# Patient Record
Sex: Female | Born: 1960 | Race: Black or African American | Hispanic: No | Marital: Single | Smoking: Never smoker
Health system: Southern US, Community
[De-identification: ages and names within clinical notes are randomized; demographics above are authoritative.]

## PROBLEM LIST (undated history)

## (undated) DIAGNOSIS — R7303 Prediabetes: Secondary | ICD-10-CM

## (undated) DIAGNOSIS — E559 Vitamin D deficiency, unspecified: Secondary | ICD-10-CM

## (undated) DIAGNOSIS — R011 Cardiac murmur, unspecified: Secondary | ICD-10-CM

## (undated) DIAGNOSIS — I00 Rheumatic fever without heart involvement: Secondary | ICD-10-CM

## (undated) HISTORY — DX: Rheumatic fever without heart involvement: I00

## (undated) HISTORY — DX: Cardiac murmur, unspecified: R01.1

## (undated) HISTORY — DX: Vitamin D deficiency, unspecified: E55.9

## (undated) HISTORY — PX: CYSTECTOMY: SUR359

## (undated) HISTORY — DX: Prediabetes: R73.03

---

## 1993-07-30 HISTORY — PX: OTHER SURGICAL HISTORY: SHX169

## 1997-10-01 ENCOUNTER — Encounter: Admission: RE | Admit: 1997-10-01 | Discharge: 1997-10-01 | Payer: Self-pay | Admitting: Sports Medicine

## 1997-10-15 ENCOUNTER — Encounter: Admission: RE | Admit: 1997-10-15 | Discharge: 1997-10-15 | Payer: Self-pay | Admitting: Family Medicine

## 1997-11-30 ENCOUNTER — Emergency Department (HOSPITAL_COMMUNITY): Admission: EM | Admit: 1997-11-30 | Discharge: 1997-11-30 | Payer: Self-pay

## 1997-12-18 ENCOUNTER — Encounter: Admission: RE | Admit: 1997-12-18 | Discharge: 1997-12-18 | Payer: Self-pay | Admitting: Family Medicine

## 1998-12-23 ENCOUNTER — Other Ambulatory Visit: Admission: RE | Admit: 1998-12-23 | Discharge: 1998-12-23 | Payer: Self-pay | Admitting: *Deleted

## 1999-01-24 ENCOUNTER — Ambulatory Visit (HOSPITAL_COMMUNITY): Admission: RE | Admit: 1999-01-24 | Discharge: 1999-01-24 | Payer: Self-pay | Admitting: Obstetrics and Gynecology

## 1999-02-27 ENCOUNTER — Encounter: Admission: RE | Admit: 1999-02-27 | Discharge: 1999-02-27 | Payer: Self-pay | Admitting: Family Medicine

## 1999-03-27 ENCOUNTER — Encounter: Payer: Self-pay | Admitting: Emergency Medicine

## 1999-03-27 ENCOUNTER — Emergency Department (HOSPITAL_COMMUNITY): Admission: EM | Admit: 1999-03-27 | Discharge: 1999-03-27 | Payer: Self-pay | Admitting: Emergency Medicine

## 1999-06-03 ENCOUNTER — Encounter: Admission: RE | Admit: 1999-06-03 | Discharge: 1999-06-03 | Payer: Self-pay | Admitting: Family Medicine

## 1999-06-12 ENCOUNTER — Encounter: Admission: RE | Admit: 1999-06-12 | Discharge: 1999-06-12 | Payer: Self-pay | Admitting: Family Medicine

## 1999-11-29 ENCOUNTER — Emergency Department (HOSPITAL_COMMUNITY): Admission: EM | Admit: 1999-11-29 | Discharge: 1999-11-29 | Payer: Self-pay | Admitting: Emergency Medicine

## 1999-11-29 ENCOUNTER — Encounter: Payer: Self-pay | Admitting: Emergency Medicine

## 2000-03-11 ENCOUNTER — Ambulatory Visit (HOSPITAL_COMMUNITY): Admission: RE | Admit: 2000-03-11 | Discharge: 2000-03-11 | Payer: Self-pay | Admitting: Family Medicine

## 2000-03-25 ENCOUNTER — Encounter: Admission: RE | Admit: 2000-03-25 | Discharge: 2000-03-25 | Payer: Self-pay | Admitting: Family Medicine

## 2000-08-03 ENCOUNTER — Encounter: Admission: RE | Admit: 2000-08-03 | Discharge: 2000-08-03 | Payer: Self-pay | Admitting: Sports Medicine

## 2001-03-10 ENCOUNTER — Encounter: Admission: RE | Admit: 2001-03-10 | Discharge: 2001-03-10 | Payer: Self-pay | Admitting: Family Medicine

## 2001-12-16 ENCOUNTER — Encounter: Admission: RE | Admit: 2001-12-16 | Discharge: 2001-12-16 | Payer: Self-pay | Admitting: Family Medicine

## 2002-01-04 ENCOUNTER — Encounter: Admission: RE | Admit: 2002-01-04 | Discharge: 2002-01-04 | Payer: Self-pay | Admitting: Family Medicine

## 2002-11-08 ENCOUNTER — Encounter: Admission: RE | Admit: 2002-11-08 | Discharge: 2002-11-08 | Payer: Self-pay | Admitting: Family Medicine

## 2002-11-27 ENCOUNTER — Encounter: Admission: RE | Admit: 2002-11-27 | Discharge: 2002-11-27 | Payer: Self-pay | Admitting: Family Medicine

## 2003-06-30 HISTORY — PX: TUBAL LIGATION: SHX77

## 2003-08-22 ENCOUNTER — Encounter: Admission: RE | Admit: 2003-08-22 | Discharge: 2003-08-22 | Payer: Self-pay | Admitting: Family Medicine

## 2004-02-21 ENCOUNTER — Encounter: Admission: RE | Admit: 2004-02-21 | Discharge: 2004-02-21 | Payer: Self-pay | Admitting: Sports Medicine

## 2004-09-06 ENCOUNTER — Emergency Department (HOSPITAL_COMMUNITY): Admission: EM | Admit: 2004-09-06 | Discharge: 2004-09-06 | Payer: Self-pay | Admitting: Emergency Medicine

## 2005-03-26 ENCOUNTER — Ambulatory Visit: Payer: Self-pay | Admitting: Family Medicine

## 2006-12-24 ENCOUNTER — Emergency Department (HOSPITAL_COMMUNITY): Admission: EM | Admit: 2006-12-24 | Discharge: 2006-12-24 | Payer: Self-pay | Admitting: Emergency Medicine

## 2007-01-05 ENCOUNTER — Ambulatory Visit (HOSPITAL_COMMUNITY): Admission: RE | Admit: 2007-01-05 | Discharge: 2007-01-05 | Payer: Self-pay | Admitting: Chiropractic Medicine

## 2007-02-17 ENCOUNTER — Other Ambulatory Visit: Admission: RE | Admit: 2007-02-17 | Discharge: 2007-02-17 | Payer: Self-pay | Admitting: Family Medicine

## 2007-02-18 ENCOUNTER — Encounter: Admission: RE | Admit: 2007-02-18 | Discharge: 2007-02-18 | Payer: Self-pay | Admitting: Family Medicine

## 2007-12-03 ENCOUNTER — Emergency Department (HOSPITAL_COMMUNITY): Admission: EM | Admit: 2007-12-03 | Discharge: 2007-12-04 | Payer: Self-pay | Admitting: Emergency Medicine

## 2008-04-09 ENCOUNTER — Other Ambulatory Visit: Admission: RE | Admit: 2008-04-09 | Discharge: 2008-04-09 | Payer: Self-pay | Admitting: Family Medicine

## 2008-10-04 ENCOUNTER — Emergency Department (HOSPITAL_COMMUNITY): Admission: EM | Admit: 2008-10-04 | Discharge: 2008-10-04 | Payer: Self-pay | Admitting: Emergency Medicine

## 2009-01-18 ENCOUNTER — Other Ambulatory Visit: Admission: RE | Admit: 2009-01-18 | Discharge: 2009-01-18 | Payer: Self-pay | Admitting: Family Medicine

## 2010-07-20 ENCOUNTER — Encounter: Payer: Self-pay | Admitting: Family Medicine

## 2010-09-19 ENCOUNTER — Other Ambulatory Visit (HOSPITAL_COMMUNITY)
Admission: RE | Admit: 2010-09-19 | Discharge: 2010-09-19 | Disposition: A | Discharge status: Home / Self Care | Payer: Self-pay | Source: Ambulatory Visit | Attending: Internal Medicine | Admitting: Internal Medicine

## 2010-09-19 ENCOUNTER — Other Ambulatory Visit: Payer: Self-pay

## 2010-09-19 DIAGNOSIS — Z01419 Encounter for gynecological examination (general) (routine) without abnormal findings: Secondary | ICD-10-CM | POA: Insufficient documentation

## 2010-10-07 ENCOUNTER — Other Ambulatory Visit (HOSPITAL_COMMUNITY): Payer: Self-pay | Admitting: Family Medicine

## 2010-10-07 DIAGNOSIS — Z1231 Encounter for screening mammogram for malignant neoplasm of breast: Secondary | ICD-10-CM

## 2010-10-09 ENCOUNTER — Other Ambulatory Visit (HOSPITAL_COMMUNITY): Payer: Self-pay | Admitting: Family Medicine

## 2010-10-09 DIAGNOSIS — Z1231 Encounter for screening mammogram for malignant neoplasm of breast: Secondary | ICD-10-CM

## 2010-10-17 ENCOUNTER — Ambulatory Visit (HOSPITAL_COMMUNITY)
Admission: RE | Admit: 2010-10-17 | Discharge: 2010-10-17 | Disposition: A | Discharge status: Home / Self Care | Payer: Self-pay | Source: Ambulatory Visit | Attending: Family Medicine | Admitting: Family Medicine

## 2010-10-17 DIAGNOSIS — Z1231 Encounter for screening mammogram for malignant neoplasm of breast: Secondary | ICD-10-CM

## 2012-09-01 ENCOUNTER — Other Ambulatory Visit (HOSPITAL_COMMUNITY): Payer: Self-pay | Admitting: Cardiology

## 2012-09-13 ENCOUNTER — Ambulatory Visit (HOSPITAL_COMMUNITY)
Admission: RE | Admit: 2012-09-13 | Discharge: 2012-09-13 | Disposition: A | Discharge status: Home / Self Care | Payer: BC Managed Care – PPO | Source: Ambulatory Visit | Attending: Cardiology | Admitting: Cardiology

## 2012-09-13 DIAGNOSIS — R011 Cardiac murmur, unspecified: Secondary | ICD-10-CM | POA: Insufficient documentation

## 2012-09-13 NOTE — Progress Notes (Signed)
2D Echo Performed 09/13/2012    Sabel Hornbeck, RCS  

## 2012-11-25 ENCOUNTER — Encounter (HOSPITAL_COMMUNITY): Payer: Self-pay | Admitting: Emergency Medicine

## 2012-11-25 ENCOUNTER — Emergency Department (HOSPITAL_COMMUNITY)
Admission: EM | Admit: 2012-11-25 | Discharge: 2012-11-25 | Disposition: A | Discharge status: Home / Self Care | Payer: BC Managed Care – PPO | Attending: Emergency Medicine | Admitting: Emergency Medicine

## 2012-11-25 DIAGNOSIS — L509 Urticaria, unspecified: Secondary | ICD-10-CM | POA: Insufficient documentation

## 2012-11-25 DIAGNOSIS — R238 Other skin changes: Secondary | ICD-10-CM

## 2012-11-25 DIAGNOSIS — L299 Pruritus, unspecified: Secondary | ICD-10-CM | POA: Insufficient documentation

## 2012-11-25 MED ORDER — HYDROXYZINE HCL 25 MG PO TABS: 25.0000 mg | ORAL_TABLET | Freq: Four times a day (QID) | ORAL | Status: DC

## 2012-11-25 MED ORDER — HYDROXYZINE HCL 25 MG PO TABS
25.0000 mg | ORAL_TABLET | Freq: Once | ORAL | Status: AC
  Administered 2012-11-25: 25 mg via ORAL
  Filled 2012-11-25: qty 1

## 2012-11-25 MED ORDER — HYDROCORTISONE 1 % EX CREA: TOPICAL_CREAM | Freq: Two times a day (BID) | CUTANEOUS | Status: DC
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
## 2012-11-25 NOTE — ED Notes (Signed)
She tells [place] that 3 days ago she "got a lot of sun".  She c/o pruritic rash on all extremities and wherever she was sun exposed.  The "burning" of the rash was made worse by her use of a Nutrogena skin moisturizer.  She is breathing normally, and in no distress.

## 2012-11-25 NOTE — ED Notes (Signed)
Per patient, skin itching, burning for 3 days-thinks it is related to sun exposure, had similar episode in past

## 2012-11-26 NOTE — ED Provider Notes (Signed)
Medical screening examination/treatment/procedure(s) were performed by non-physician practitioner and as supervising physician I was immediately available for consultation/collaboration.    Vida Roller, MD 11/26/12 [phone]

## 2013-05-29 ENCOUNTER — Emergency Department (HOSPITAL_COMMUNITY)
Admission: EM | Admit: 2013-05-29 | Discharge: 2013-05-29 | Disposition: A | Discharge status: Home / Self Care | Payer: BC Managed Care – PPO | Attending: Emergency Medicine | Admitting: Emergency Medicine

## 2013-05-29 ENCOUNTER — Emergency Department (HOSPITAL_COMMUNITY): Payer: BC Managed Care – PPO

## 2013-05-29 ENCOUNTER — Encounter (HOSPITAL_COMMUNITY): Payer: Self-pay | Admitting: Emergency Medicine

## 2013-05-29 DIAGNOSIS — IMO0002 Reserved for concepts with insufficient information to code with codable children: Secondary | ICD-10-CM | POA: Insufficient documentation

## 2013-05-29 DIAGNOSIS — Z880 Allergy status to penicillin: Secondary | ICD-10-CM | POA: Insufficient documentation

## 2013-05-29 DIAGNOSIS — Z79899 Other long term (current) drug therapy: Secondary | ICD-10-CM | POA: Insufficient documentation

## 2013-05-29 DIAGNOSIS — S0993XA Unspecified injury of face, initial encounter: Secondary | ICD-10-CM | POA: Insufficient documentation

## 2013-05-29 DIAGNOSIS — Y92410 Unspecified street and highway as the place of occurrence of the external cause: Secondary | ICD-10-CM | POA: Insufficient documentation

## 2013-05-29 DIAGNOSIS — M542 Cervicalgia: Secondary | ICD-10-CM

## 2013-05-29 DIAGNOSIS — Y9389 Activity, other specified: Secondary | ICD-10-CM | POA: Insufficient documentation

## 2013-05-29 DIAGNOSIS — R110 Nausea: Secondary | ICD-10-CM | POA: Insufficient documentation

## 2013-05-29 MED ORDER — TRAMADOL HCL 50 MG PO TABS: 50.0000 mg | ORAL_TABLET | Freq: Four times a day (QID) | ORAL | Status: DC | PRN

## 2013-05-29 MED ORDER — TRAMADOL HCL 50 MG PO TABS
50.0000 mg | ORAL_TABLET | Freq: Once | ORAL | Status: AC
  Administered 2013-05-29: 50 mg via ORAL
  Filled 2013-05-29: qty 1

## 2013-05-29 MED ORDER — ONDANSETRON 8 MG PO TBDP
8.0000 mg | ORAL_TABLET | Freq: Once | ORAL | Status: AC
  Administered 2013-05-29: 8 mg via ORAL
  Filled 2013-05-29: qty 1
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
## 2013-05-29 NOTE — ED Notes (Signed)
Patient transported to CT 

## 2013-05-29 NOTE — ED Notes (Signed)
Bed: WA14 Expected date:  Expected time:  Means of arrival:  Comments: Ems-  
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
## 2013-10-05 LAB — HM MAMMOGRAPHY

## 2014-07-05 ENCOUNTER — Encounter (HOSPITAL_COMMUNITY): Payer: Self-pay | Admitting: Emergency Medicine

## 2014-07-05 ENCOUNTER — Emergency Department (HOSPITAL_COMMUNITY)
Admission: EM | Admit: 2014-07-05 | Discharge: 2014-07-05 | Disposition: A | Discharge status: Home / Self Care | Payer: No Typology Code available for payment source | Attending: Emergency Medicine | Admitting: Emergency Medicine

## 2014-07-05 DIAGNOSIS — S299XXA Unspecified injury of thorax, initial encounter: Secondary | ICD-10-CM | POA: Insufficient documentation

## 2014-07-05 DIAGNOSIS — M7918 Myalgia, other site: Secondary | ICD-10-CM

## 2014-07-05 DIAGNOSIS — Y998 Other external cause status: Secondary | ICD-10-CM | POA: Diagnosis not present

## 2014-07-05 DIAGNOSIS — Z880 Allergy status to penicillin: Secondary | ICD-10-CM | POA: Diagnosis not present

## 2014-07-05 DIAGNOSIS — Y9389 Activity, other specified: Secondary | ICD-10-CM | POA: Insufficient documentation

## 2014-07-05 DIAGNOSIS — Y92410 Unspecified street and highway as the place of occurrence of the external cause: Secondary | ICD-10-CM | POA: Insufficient documentation

## 2014-07-05 DIAGNOSIS — S4992XA Unspecified injury of left shoulder and upper arm, initial encounter: Secondary | ICD-10-CM | POA: Insufficient documentation

## 2014-07-05 MED ORDER — ACETAMINOPHEN 500 MG PO TABS
1000.0000 mg | ORAL_TABLET | Freq: Once | ORAL | Status: AC
  Administered 2014-07-05: 1000 mg via ORAL
  Filled 2014-07-05: qty 2

## 2014-07-05 MED ORDER — HYDROCODONE-ACETAMINOPHEN 5-325 MG PO TABS: ORAL_TABLET | ORAL | Status: DC

## 2014-07-05 MED ORDER — ONDANSETRON 4 MG PO TBDP
4.0000 mg | ORAL_TABLET | Freq: Once | ORAL | Status: AC
  Administered 2014-07-05: 4 mg via ORAL
  Filled 2014-07-05: qty 1
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
## 2014-07-05 NOTE — Discharge Instructions (Signed)
For pain control please take ibuprofen (also known as Motrin or Advil) 800mg  (this is normally 4 over the counter pills) 3 times a day  for 5 days. Take with food to minimize stomach irritation.   Take vicodin for breakthrough pain, do not drink alcohol, drive, care for children or do other critical tasks while taking vicodin.  Do not hesitate to return to the emergency room for any new, worsening or concerning symptoms.  Please obtain primary care using resource guide below. But the minute you were seen in the emergency room and that they will need to obtain records for further outpatient management.    Emergency Department Resource Guide 1) Find a Doctor and Pay Out of Pocket Although you won't have to find out who is covered by your insurance plan, it is a good idea to ask around and get recommendations. You will then need to call the office and see if the doctor you have chosen will accept you as a new patient and what types of options they offer for patients who are self-pay. Some doctors offer discounts or will set up payment plans for their patients who do not have insurance, but you will need to ask so you aren't surprised when you get to your appointment.  2) Contact Your Local Health Department Not all health departments have doctors that can see patients for sick visits, but many do, so it is worth a call to see if yours does. If you don't know where your local health department is, you can check in your phone book. The CDC also has a tool to help you locate your state's health department, and many state websites also have listings of all of their local health departments.  3) Find a Walk-in Clinic If your illness is not likely to be very severe or complicated, you may want to try a walk in clinic. These are popping up all over the country in pharmacies, drugstores, and shopping centers. They're usually staffed by nurse practitioners or physician assistants that have been trained to  treat common illnesses and complaints. They're usually fairly quick and inexpensive. However, if you have serious medical issues or chronic medical problems, these are probably not your best option.  No Primary Care Doctor: - Call Health Connect at  [phone] - they can help you locate a primary care doctor that  accepts your insurance, provides certain services, etc. - Physician Referral Service- [phone]  Chronic Pain Problems: Organization         Address  Phone   Notes  [facility]  [phone] Patients need to be referred by their primary care doctor.   Medication Assistance: Organization         Address  Phone   Notes  [facility] [address] [place], [place] [address] [phone]) [phone] --Must be a resident of [facility] [place] -- Must have NO insurance coverage whatsoever (no Medicaid/ Medicare, etc.) -- The pt. MUST have a primary care doctor that directs their care regularly and follows them in the community   [facility]  [phone]   [employer]  [phone]    Agencies that provide inexpensive medical care: Organization         Address  Phone   Notes  [facility]  [phone]   [facility]    [phone]   [facility] [address] [place], [place] [address] [phone]  [facility] [address], °[place] ([phone]) [phone]   °[facility]    (336) 373-0678   °[facility]    (336) 272-1050   °[facility] ° [address], [place] Phone:  ([phone]) [phone], Fax:  ([phone] Hours of Operation:  9 am - 6 pm, M-F.  Also accepts Medicaid/Medicare and self-pay.  °[facility] ° [address], [place] Phone: ([phone]) [phone], Fax: [phone]. Hours of Operation:  8:30 am - 5:30 pm, M-F.  Also accepts Medicaid and self-pay.  °[facility]  [place] [address], [place] Phone: ([phone]) [phone]   °[facility] [address], [place], [place] ([phone], Ext. 123 Mondays & Thursdays: 7-9 AM.  First 15 patients are seen on a first come, first serve basis. °  ° °Medicaid-accepting [place] Providers: ° °Organization         Address  Phone   Notes  °[facility] [address], [place] ([phone]) [phone] Also accepts self-pay patients.  °[facility] [address], [place] ° ([phone]) [phone]   °[facility] [address], [place] (336) [phone]   °[facility] [address] [place] [address], [place] (336) [phone]   °Veita Bland [address], [place]  ° ([phone]) [phone] Only accepts [place] Access Medicaid patients after they have their name applied to their card.  ° °Self-Pay (no insurance) in [place]: ° °Organization         Address  Phone   Notes  °Sickle Cell Patients, [facility] [address], [place] ([phone]) [phone]   °[facility] [address], [place] (336) [phone]   °[facility] ° [address], [place] (336) [phone]   °[facility]/Dr. Osei-Bonsu ° [address] [place] [address], [place] or [address], [place] ([phone]) [phone] Phone number for both [place] and [place] locations is the same.  °[facility] [address], [place] ([phone]) [phone]   °[facility] [address] [place] [address], [place] or [address] (336) [phone] °(336) [phone]   °[facility] [address],  (336) [phone], phone; ([phone]) [phone], fax Sees patients 1st and 3rd Saturday of every month.  Must not qualify for public or private insurance (i.e. Medicaid, Medicare, [place] Health Choice, Veterans' Benefits) • Household income should be no more than 200%  of the poverty level •The clinic cannot treat you if you are pregnant or think you are pregnant • Sexually transmitted diseases are not treated at the clinic.  ° ° °Dental Care: °Organization         Address  Phone  Notes  °[place] [facility] [address],  (336) [phone] Accepts children up to age 21 who are enrolled in Medicaid or [place] Health Choice; pregnant women with a Medicaid card; and children who have applied for Medicaid or [place] Health Choice, but were declined, whose parents can pay a reduced fee at time of service.  °[place] [facility] [place]  [address], [place] (336) 641-7733 Accepts children up to age 21 who are enrolled in Medicaid or  Health Choice; pregnant women with a Medicaid card; and   children who have applied for Medicaid or [place] Health Choice, but were declined, whose parents can pay a reduced fee at time of service.  [facility]  [place] [phone] Patients are seen by appointment only. Walk-ins are not accepted. [place] will see patients 49 years of age and older. Monday - Tuesday (8am-5pm) Most Wednesdays (8:30-5pm) $30 per visit, cash only  [facility]  [address], [facility] [phone] Patients are seen by appointment only. Walk-ins are not accepted. [place] will see patients 12 years of age and older. One Wednesday Evening (Monthly: Volunteer Based).  $30 per visit, cash only  [place]  [phone] for adults; Children under age 36, call Graduate Pediatric Dentistry at [phone]. Children aged 37-14, please call [phone] to request a pediatric application.  Dental services are provided in all areas of dental care including fillings, crowns and bridges, complete and partial dentures, implants, gum treatment, root canals, and  extractions. Preventive care is also provided. Treatment is provided to both adults and children. Patients are selected via a lottery and there is often a waiting list.   [facility] [address], [place]  [phone] [email or web address]   [facility] [address] [place], [place] [phone], Ext. [phone] Second and Fourth Thursday of each month, opens at 6:30 AM; Clinic ends at 9 AM.  Patients are seen on a first-come first-served basis, and a limited number are seen during each clinic.   [facility]  [address] Hillard Danker [place], [place] [phone]   Eligibility Requirements You must have lived in [place], [place], or [place] counties for at least the last three months.   You cannot be eligible for state or federal sponsored [employer], including [employer], [place], or [employer].   You generally cannot be eligible for healthcare insurance through your employer.    How to apply: Eligibility screenings are held every Tuesday and Wednesday afternoon from 1:00 pm until 4:00 pm. You do not need an appointment for the interview!  [facility] [address] [place], [place]   [place]  [place] Department  [place]  [phone]    Behavioral Health Resources in the Community: Intensive Outpatient Programs Organization         Address  Phone  Notes  [facility] [place] [address], [place], [place] [phone]   [facility] [address], [place], [place]   [facility] [address] [place], [place]   [place] [address],  [place], [place] or [phone]   Substance Abuse Resources Organization         Address  Phone  Notes  [facility]  [phone]     [place]  [phone]   [facility] [place]   Chinita Pester  [phone]   Residential & Outpatient Substance Abuse Program  [phone]   Psychological Services Organization         Address  Phone  Notes  [facility] [place]  [place]  [phone]   [place] [address] [place] or [phone]    Mobile Crisis Teams Organization  Address  Phone  Notes  °[facility], Mobile Crisis Care Unit  1-877-626-1772   °Assertive °[facility] ° [address] [place], [place] [phone]   °Sharon DeEsch [address] °[place] [phone]   ° °Self-Help/Support Groups °Organization         Address  Phone             Notes  °[facility] [place] - variety of support groups  336- [phone] Call for more information  °Narcotics Anonymous (NA), [facility] [address], °[place]  2 meetings at this location  ° °Residential Treatment Programs °Organization         Address  Phone  Notes  °[facility] [address],    °[place]  1-866-801-8205   °[facility] ° [address], [place], [place] [phone]   °[facility] [address], [place] [phone] Admissions: 8am-3pm M-F  °[facility] [address]    °[place], [place] 336-841-1104   °[facility] [address], [place], [place] [phone]   °[facility] [address]  °[place], [place] 336-285-9073   °[facility] - Intensive Outpatient [address], [place], [place] 336-852-3033   °[facility] [address]  °[place], [place] 1-877-615-2722 or 336-784-9470   °[facility] ([facility]) [address] [place], [place] 336-227-7417 Accepts Medicaid  °[facility] [address]   ° [place] 1-800-659-3381 Substance Abuse/Addiction Treatment  ° °[place] Behavioral Health Resources °Organization         Address  Phone  Notes  °[facility]  (888) [phone]   °Julie Brannon, PhD [address] [place], [place]   (336) 349-5553 or (336) 951-0000   °[facility]   [address] °[place], [place] (336) 349-4454   °[facility] [address], [place], [place] (336) 342-8316 Insurance/Medicaid/sponsorship through [facility]  °[facility] [address] [place] [address]                                    [place], [place] (336) 342-8316 Therapy/tele-psych/case  °[facility] [address]  ° [place], [place] (336) [phone]    °Dr. Arfeen  (336) 349-4544   °[facility] [place]  [employer] [place] [facility] 1) [address], [place] °2) [address], [place] °3)  [address], [place] (336) 349-3220 °(336) 342-7768 ° °(336) 342-8140   °[place] Child Abuse Hotline (336) 342-1394 or (336) 342-3537 (After Hours)    ° ° ° °
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
## 2014-07-10 ENCOUNTER — Emergency Department (HOSPITAL_COMMUNITY)
Admission: EM | Admit: 2014-07-10 | Discharge: 2014-07-10 | Disposition: A | Discharge status: Home / Self Care | Payer: No Typology Code available for payment source | Attending: Emergency Medicine | Admitting: Emergency Medicine

## 2014-07-10 ENCOUNTER — Emergency Department (HOSPITAL_COMMUNITY): Payer: No Typology Code available for payment source

## 2014-07-10 ENCOUNTER — Encounter (HOSPITAL_COMMUNITY): Payer: Self-pay | Admitting: Emergency Medicine

## 2014-07-10 DIAGNOSIS — Z79899 Other long term (current) drug therapy: Secondary | ICD-10-CM | POA: Insufficient documentation

## 2014-07-10 DIAGNOSIS — Z880 Allergy status to penicillin: Secondary | ICD-10-CM | POA: Diagnosis not present

## 2014-07-10 DIAGNOSIS — M25512 Pain in left shoulder: Secondary | ICD-10-CM | POA: Diagnosis not present

## 2014-07-10 DIAGNOSIS — R0789 Other chest pain: Secondary | ICD-10-CM | POA: Diagnosis not present

## 2014-07-10 MED ORDER — OXYCODONE-ACETAMINOPHEN 5-325 MG PO TABS: 1.0000 | ORAL_TABLET | Freq: Four times a day (QID) | ORAL | Status: DC | PRN

## 2014-07-10 MED ORDER — IBUPROFEN 600 MG PO TABS: 600.0000 mg | ORAL_TABLET | Freq: Four times a day (QID) | ORAL | Status: DC | PRN

## 2014-07-10 MED ORDER — CYCLOBENZAPRINE HCL 5 MG PO TABS: 5.0000 mg | ORAL_TABLET | Freq: Three times a day (TID) | ORAL | Status: DC | PRN
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
## 2014-07-10 NOTE — Discharge Instructions (Signed)
Return to the ED with any concerns including difficulty breathing, swelling of arms, weakness in arms or legs, abdominal pain, vomiting, fainting, decreased level of alertness/lethargy, or any other alarming symptoms
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
## 2014-07-10 NOTE — ED Notes (Signed)
MD at bedside. 

## 2014-11-13 ENCOUNTER — Ambulatory Visit: Payer: Self-pay | Admitting: Internal Medicine

## 2014-11-13 DIAGNOSIS — Z0289 Encounter for other administrative examinations: Secondary | ICD-10-CM

## 2014-12-28 ENCOUNTER — Encounter: Payer: Self-pay | Admitting: Internal Medicine

## 2014-12-28 ENCOUNTER — Ambulatory Visit (INDEPENDENT_AMBULATORY_CARE_PROVIDER_SITE_OTHER): Payer: BLUE CROSS/BLUE SHIELD | Admitting: Internal Medicine

## 2014-12-28 ENCOUNTER — Other Ambulatory Visit (INDEPENDENT_AMBULATORY_CARE_PROVIDER_SITE_OTHER): Payer: BLUE CROSS/BLUE SHIELD

## 2014-12-28 VITALS — BP 110/68 | HR 61 | Temp 98.3°F | Resp 16 | Ht 62.0 in | Wt 143.0 lb

## 2014-12-28 DIAGNOSIS — Z0000 Encounter for general adult medical examination without abnormal findings: Secondary | ICD-10-CM

## 2014-12-28 LAB — COMPREHENSIVE METABOLIC PANEL
ALT: 16 U/L (ref 0–35)
AST: 19 U/L (ref 0–37)
Albumin: 4.1 g/dL (ref 3.5–5.2)
Alkaline Phosphatase: 70 U/L (ref 39–117)
BILIRUBIN TOTAL: 0.8 mg/dL (ref 0.2–1.2)
BUN: 11 mg/dL (ref 6–23)
CHLORIDE: 108 meq/L (ref 96–112)
CO2: 26 mEq/L (ref 19–32)
CREATININE: 1.02 mg/dL (ref 0.40–1.20)
Calcium: 9.8 mg/dL (ref 8.4–10.5)
GFR: 72.84 mL/min (ref >60.00)
GLUCOSE: 76 mg/dL (ref 70–99)
Potassium: 3.9 mEq/L (ref 3.5–5.1)
Sodium: 142 mEq/L (ref 135–145)
Total Protein: 7.5 g/dL (ref 6.0–8.3)

## 2014-12-28 LAB — LIPID PANEL
CHOLESTEROL: 193 mg/dL (ref 0–200)
HDL: 74.3 mg/dL (ref >39.00)
LDL CALC: 95 mg/dL (ref 0–99)
NonHDL: 118.7
TRIGLYCERIDES: 119 mg/dL (ref 0.0–149.0)
Total CHOL/HDL Ratio: 3
VLDL: 23.8 mg/dL (ref 0.0–40.0)

## 2014-12-28 LAB — CBC
HEMATOCRIT: 44.1 % (ref 36.0–46.0)
Hemoglobin: 14.4 g/dL (ref 12.0–15.0)
MCHC: 32.6 g/dL (ref 30.0–36.0)
MCV: 93.1 fl (ref 78.0–100.0)
PLATELETS: 180 10*3/uL (ref 150.0–400.0)
RBC: 4.74 Mil/uL (ref 3.87–5.11)
RDW: 14.3 % (ref 11.5–15.5)
WBC: 3.7 10*3/uL — ABNORMAL LOW (ref 4.0–10.5)

## 2014-12-28 LAB — HEMOGLOBIN A1C: Hgb A1c MFr Bld: 5.7 % (ref 4.6–6.5)

## 2014-12-28 LAB — TSH: TSH: 3.59 u[IU]/mL (ref 0.35–4.50)

## 2014-12-28 MED ORDER — IBUPROFEN 600 MG PO TABS: 600.0000 mg | ORAL_TABLET | Freq: Four times a day (QID) | ORAL | Status: DC | PRN

## 2014-12-28 NOTE — Patient Instructions (Signed)
We will check the blood work today and call you back with the results.   For a good gyn we recommend these 2 groups (they both have websites with pictures of their doctors)  [facility]: Address: [address] [place], [place] [address]  Phone: [phone]  [facility]: Address: [address] [place], [place] [address]  Phone: [phone]   We will also get the mammogram put in and the colonoscopy so you should hear back about those.   Health Maintenance Adopting a healthy lifestyle and getting preventive care can go a long way to promote health and wellness. Talk with your health care provider about what schedule of regular examinations is right for you. This is a good chance for you to check in with your provider about disease prevention and staying healthy. In between checkups, there are plenty of things you can do on your own. Experts have done a lot of research about which lifestyle changes and preventive measures are most likely to keep you healthy. Ask your health care provider for more information. WEIGHT AND DIET  Eat a healthy diet  Be sure to include plenty of vegetables, fruits, low-fat dairy products, and lean protein.  Do not eat a lot of foods high in solid fats, added sugars, or salt.  Get regular exercise. This is one of the most important things you can do for your health.  Most adults should exercise for at least 150 minutes each week. The exercise should increase your heart rate and make you sweat (moderate-intensity exercise).  Most adults should also do strengthening exercises at least twice a week. This is in addition to the moderate-intensity exercise.  Maintain a healthy weight  Body mass index (BMI) is a measurement that can be used to identify possible weight problems. It estimates body fat based on height and weight. Your health care provider can help determine your BMI and help you achieve or maintain a healthy weight.  For females  77 years of age and older:   A BMI below 18.5 is considered underweight.  A BMI of 18.5 to 24.9 is normal.  A BMI of 25 to 29.9 is considered overweight.  A BMI of 30 and above is considered obese.  Watch levels of cholesterol and blood lipids  You should start having your blood tested for lipids and cholesterol at 53 years of age, then have this test every 5 years.  You may need to have your cholesterol levels checked more often if:  Your lipid or cholesterol levels are high.  You are older than 53 years of age.  You are at high risk for heart disease.  CANCER SCREENING   Lung Cancer  Lung cancer screening is recommended for adults 80-66 years old who are at high risk for lung cancer because of a history of smoking.  A yearly low-dose CT scan of the lungs is recommended for people who:  Currently smoke.  Have quit within the past 15 years.  Have at least a 30-pack-year history of smoking. A pack year is smoking an average of one pack of cigarettes a day for 1 year.  Yearly screening should continue until it has been 15 years since you quit.  Yearly screening should stop if you develop a health problem that would prevent you from having lung cancer treatment.  Breast Cancer  Practice breast self-awareness. This means understanding how your breasts normally appear and feel.  It also means doing regular breast self-exams. Let your  provider know about any changes, no matter how small.  If you are in your 20s or 30s, you should have a clinical breast exam (CBE) by a health care provider every 1-3 years as part of a regular health exam.  If you are 40 or older, have a CBE every year. Also consider having a breast X-ray (mammogram) every year.  If you have a family history of breast cancer, talk to your health care provider about genetic screening.  If you are at high risk for breast cancer, talk to your health care provider about having an MRI and a mammogram  every year.  Breast cancer gene (BRCA) assessment is recommended for women who have family members with BRCA-related cancers. BRCA-related cancers include:  Breast.  Ovarian.  Tubal.  Peritoneal cancers.  Results of the assessment will determine the need for genetic counseling and BRCA1 and BRCA2 testing. Cervical Cancer Routine pelvic examinations to screen for cervical cancer are no longer recommended for nonpregnant women who are considered low risk for cancer of the pelvic organs (ovaries, uterus, and vagina) and who do not have symptoms. A pelvic examination may be necessary if you have symptoms including those associated with pelvic infections. Ask your health care provider if a screening pelvic exam is right for you.   The Pap test is the screening test for cervical cancer for women who are considered at risk.  If you had a hysterectomy for a problem that was not cancer or a condition that could lead to cancer, then you no longer need Pap tests.  If you are older than 65 years, and you have had normal Pap tests for the past 10 years, you no longer need to have Pap tests.  If you have had past treatment for cervical cancer or a condition that could lead to cancer, you need Pap tests and screening for cancer for at least 20 years after your treatment.  If you no longer get a Pap test, assess your risk factors if they change (such as having a new sexual partner). This can affect whether you should start being screened again.  Some women have medical problems that increase their chance of getting cervical cancer. If this is the case for you, your health care provider may recommend more frequent screening and Pap tests.  The human papillomavirus (HPV) test is another test that may be used for cervical cancer screening. The HPV test looks for the virus that can cause cell changes in the cervix. The cells collected during the Pap test can be tested for HPV.  The HPV test can be used to  screen women 30 years of age and older. Getting tested for HPV can extend the interval between normal Pap tests from three to five years.  An HPV test also should be used to screen women of any age who have unclear Pap test results.  After 53 years of age, women should have HPV testing as often as Pap tests.  Colorectal Cancer  This type of cancer can be detected and often prevented.  Routine colorectal cancer screening usually begins at 53 years of age and continues through 53 years of age.  Your health care provider may recommend screening at an earlier age if you have risk factors for colon cancer.  Your health care provider may also recommend using home test kits to check for hidden blood in the stool.  A small camera at the end of a tube can be used to examine your colon   directly (sigmoidoscopy or colonoscopy). This is done to check for the earliest forms of colorectal cancer.  Routine screening usually begins at age 50.  Direct examination of the colon should be repeated every 5-10 years through 53 years of age. However, you may need to be screened more often if early forms of precancerous polyps or small growths are found. Skin Cancer  Check your skin from head to toe regularly.  Tell your health care provider about any new moles or changes in moles, especially if there is a change in a mole's shape or color.  Also tell your health care provider if you have a mole that is larger than the size of a pencil eraser.  Always use sunscreen. Apply sunscreen liberally and repeatedly throughout the day.  Protect yourself by wearing long sleeves, pants, a wide-brimmed hat, and sunglasses whenever you are outside. HEART DISEASE, DIABETES, AND HIGH BLOOD PRESSURE   Have your blood pressure checked at least every 1-2 years. High blood pressure causes heart disease and increases the risk of stroke.  If you are between 55 years and 79 years old, ask your health care provider if you should  take aspirin to prevent strokes.  Have regular diabetes screenings. This involves taking a blood sample to check your fasting blood sugar level.  If you are at a normal weight and have a low risk for diabetes, have this test once every three years after 53 years of age.  If you are overweight and have a high risk for diabetes, consider being tested at a younger age or more often. PREVENTING INFECTION  Hepatitis B  If you have a higher risk for hepatitis B, you should be screened for this virus. You are considered at high risk for hepatitis B if:  You were born in a country where hepatitis B is common. Ask your health care provider which countries are considered high risk.  Your parents were born in a high-risk country, and you have not been immunized against hepatitis B (hepatitis B vaccine).  You have HIV or AIDS.  You use needles to inject street drugs.  You live with someone who has hepatitis B.  You have had sex with someone who has hepatitis B.  You get hemodialysis treatment.  You take certain medicines for conditions, including cancer, organ transplantation, and autoimmune conditions. Hepatitis C  Blood testing is recommended for:  Everyone born from 1945 through 1965.  Anyone with known risk factors for hepatitis C. Sexually transmitted infections (STIs)  You should be screened for sexually transmitted infections (STIs) including gonorrhea and chlamydia if:  You are sexually active and are younger than 53 years of age.  You are older than 53 years of age and your health care provider tells you that you are at risk for this type of infection.  Your sexual activity has changed since you were last screened and you are at an increased risk for chlamydia or gonorrhea. Ask your health care provider if you are at risk.  If you do not have HIV, but are at risk, it may be recommended that you take a prescription medicine daily to prevent HIV infection. This is called  pre-exposure prophylaxis (PrEP). You are considered at risk if:  You are sexually active and do not regularly use condoms or know the HIV status of your partner(s).  You take drugs by injection.  You are sexually active with a partner who has HIV. Talk with your health care provider about whether you are   you are at high risk of being infected with HIV. If you choose to begin PrEP, you should first be tested for HIV. You should then be tested every 3 months for as long as you are taking PrEP.  PREGNANCY   If you are premenopausal and you may become pregnant, ask your health care provider about preconception counseling.  If you may become pregnant, take 400 to 800 micrograms (mcg) of folic acid every day.  If you want to prevent pregnancy, talk to your health care provider about birth control (contraception). OSTEOPOROSIS AND MENOPAUSE   Osteoporosis is a disease in which the bones lose minerals and strength with aging. This can result in serious bone fractures. Your risk for osteoporosis can be identified using a bone density scan.  If you are 74 years of age or older, or if you are at risk for osteoporosis and fractures, ask your health care provider if you should be screened.  Ask your health care provider whether you should take a calcium or vitamin D supplement to lower your risk for osteoporosis.  Menopause may have certain physical symptoms and risks.  Hormone replacement therapy may reduce some of these symptoms and risks. Talk to your health care provider about whether hormone replacement therapy is right for you.  HOME CARE INSTRUCTIONS   Schedule regular health, dental, and eye exams.  Stay current with your immunizations.   Do not use any tobacco products including cigarettes, chewing tobacco, or electronic cigarettes.  If you are pregnant, do not drink alcohol.  If you are breastfeeding, limit how much and how often you drink alcohol.  Limit alcohol intake to no more  than 1 drink per day for nonpregnant women. One drink equals 12 ounces of beer, 5 ounces of wine, or 1 ounces of hard liquor.  Do not use street drugs.  Do not share needles.  Ask your health care provider for help if you need support or information about quitting drugs.  Tell your health care provider if you often feel depressed.  Tell your health care provider if you have ever been abused or do not feel safe at home. Document Released: 12/29/2010 Document Revised: 10/30/2013 Document Reviewed: 05/17/2013 [facility] Patient Information 2015 [place], [place]. This information is not intended to replace advice given to you by your health care provider. Make sure you discuss any questions you have with your health care provider.

## 2014-12-28 NOTE — Progress Notes (Signed)
Pre visit review using our clinic review tool, if applicable. No additional management support is needed unless otherwise documented below in the visit note. 

## 2014-12-28 NOTE — Progress Notes (Signed)
   Subjective:    Patient ID: Kayla Barnett, female    DOB: 05/29/1961, 54 y.o.   MRN: [id number]  HPI The patient is a 54 YO female new coming in for wellness. No significant PMH.   [facility] reviewed and updated.   Review of Systems  Constitutional: Negative.   HENT: Negative.   Eyes: Negative.   Respiratory: Negative.   Cardiovascular: Negative.   Gastrointestinal: Negative.   Musculoskeletal: Negative.   Skin: Negative.   Neurological: Negative.   Psychiatric/Behavioral: Negative.       Objective:   Physical Exam  Constitutional: She is oriented to person, place, and time. She appears well-developed and well-nourished.  HENT:  Head: Normocephalic and atraumatic.  Eyes: EOM are normal.  Neck: Normal range of motion.  Cardiovascular: Normal rate and regular rhythm.   Pulmonary/Chest: Effort normal. No respiratory distress. She has no wheezes.  Abdominal: Soft. She exhibits no distension. There is no tenderness. There is no rebound.  Musculoskeletal: She exhibits no edema.  Neurological: She is alert and oriented to person, place, and time.  Skin: Skin is warm and dry.  Psychiatric: She has a normal mood and affect.   Filed Vitals:   12/28/14 1310  BP: 110/68  Pulse: 61  Temp: 98.3 F (36.8 C)  TempSrc: Oral  Resp: 16  Height: 5\' 2"  (1.575 m)  Weight: 143 lb (64.864 kg)  SpO2: 99%      Assessment & Plan:

## 2014-12-28 NOTE — Assessment & Plan Note (Signed)
Referral placed for GI for screening colonoscopy, mammogram. Checking labs today. Non-smoker and exercises regularly.

## 2014-12-29 LAB — HIV ANTIBODY (ROUTINE TESTING W REFLEX): HIV 1&2 Ab, 4th Generation: NONREACTIVE

## 2015-01-02 LAB — GC/CHLAMYDIA PROBE AMP, URINE
Chlamydia, Swab/Urine, PCR: NEGATIVE
GC Probe Amp, Urine: NEGATIVE

## 2015-01-09 ENCOUNTER — Encounter: Payer: Self-pay | Admitting: Gastroenterology

## 2015-01-18 ENCOUNTER — Encounter: Payer: Self-pay | Admitting: *Deleted

## 2015-01-22 ENCOUNTER — Encounter: Payer: Self-pay | Admitting: *Deleted

## 2015-01-31 ENCOUNTER — Ambulatory Visit (AMBULATORY_SURGERY_CENTER): Payer: Self-pay

## 2015-01-31 VITALS — Ht 61.5 in | Wt 144.0 lb

## 2015-01-31 DIAGNOSIS — Z1211 Encounter for screening for malignant neoplasm of colon: Secondary | ICD-10-CM

## 2015-01-31 MED ORDER — MOVIPREP 100 G PO SOLR: 1.0000 | Freq: Once | ORAL | Status: DC

## 2015-01-31 NOTE — Progress Notes (Signed)
No allergies to eggs or soy No diet/weight loss meds anymore-stopped phentermine about one week before previsit No home oxygen No past problems with anesthesia  Has email  Emmi instructions given for colonoscopy

## 2015-02-12 ENCOUNTER — Telehealth: Payer: Self-pay | Admitting: *Deleted

## 2015-02-12 ENCOUNTER — Emergency Department (HOSPITAL_COMMUNITY): Payer: BLUE CROSS/BLUE SHIELD

## 2015-02-12 ENCOUNTER — Emergency Department (HOSPITAL_COMMUNITY)
Admission: EM | Admit: 2015-02-12 | Discharge: 2015-02-12 | Disposition: A | Discharge status: Home / Self Care | Payer: BLUE CROSS/BLUE SHIELD | Attending: Emergency Medicine | Admitting: Emergency Medicine

## 2015-02-12 ENCOUNTER — Encounter (HOSPITAL_COMMUNITY): Payer: Self-pay | Admitting: Emergency Medicine

## 2015-02-12 DIAGNOSIS — Z792 Long term (current) use of antibiotics: Secondary | ICD-10-CM | POA: Insufficient documentation

## 2015-02-12 DIAGNOSIS — R011 Cardiac murmur, unspecified: Secondary | ICD-10-CM | POA: Diagnosis not present

## 2015-02-12 DIAGNOSIS — S8001XA Contusion of right knee, initial encounter: Secondary | ICD-10-CM | POA: Insufficient documentation

## 2015-02-12 DIAGNOSIS — S299XXA Unspecified injury of thorax, initial encounter: Secondary | ICD-10-CM | POA: Diagnosis not present

## 2015-02-12 DIAGNOSIS — Y9389 Activity, other specified: Secondary | ICD-10-CM | POA: Insufficient documentation

## 2015-02-12 DIAGNOSIS — Z880 Allergy status to penicillin: Secondary | ICD-10-CM | POA: Insufficient documentation

## 2015-02-12 DIAGNOSIS — S161XXA Strain of muscle, fascia and tendon at neck level, initial encounter: Secondary | ICD-10-CM | POA: Diagnosis not present

## 2015-02-12 DIAGNOSIS — M546 Pain in thoracic spine: Secondary | ICD-10-CM

## 2015-02-12 DIAGNOSIS — S199XXA Unspecified injury of neck, initial encounter: Secondary | ICD-10-CM | POA: Diagnosis present

## 2015-02-12 DIAGNOSIS — Y92410 Unspecified street and highway as the place of occurrence of the external cause: Secondary | ICD-10-CM | POA: Diagnosis not present

## 2015-02-12 DIAGNOSIS — Y998 Other external cause status: Secondary | ICD-10-CM | POA: Diagnosis not present

## 2015-02-12 DIAGNOSIS — Z79899 Other long term (current) drug therapy: Secondary | ICD-10-CM | POA: Diagnosis not present

## 2015-02-12 MED ORDER — CYCLOBENZAPRINE HCL 10 MG PO TABS: 10.0000 mg | ORAL_TABLET | Freq: Two times a day (BID) | ORAL | Status: DC | PRN

## 2015-02-12 MED ORDER — TRAMADOL HCL 50 MG PO TABS: 50.0000 mg | ORAL_TABLET | Freq: Four times a day (QID) | ORAL | Status: DC | PRN

## 2015-02-12 MED ORDER — NAPROXEN 500 MG PO TABS: 500.0000 mg | ORAL_TABLET | Freq: Two times a day (BID) | ORAL | Status: DC

## 2015-02-12 MED ORDER — IBUPROFEN 800 MG PO TABS
800.0000 mg | ORAL_TABLET | Freq: Once | ORAL | Status: AC
  Administered 2015-02-12: 800 mg via ORAL
  Filled 2015-02-12: qty 1
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
## 2015-02-12 NOTE — ED Notes (Signed)
Work note printed for patient. 
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
## 2015-02-15 ENCOUNTER — Encounter: Payer: BLUE CROSS/BLUE SHIELD | Admitting: Gastroenterology

## 2015-02-19 ENCOUNTER — Encounter: Payer: Self-pay | Admitting: Cardiology

## 2015-04-17 ENCOUNTER — Telehealth: Payer: Self-pay | Admitting: Gastroenterology

## 2015-04-18 NOTE — Telephone Encounter (Signed)
No charge. 

## 2015-04-19 ENCOUNTER — Encounter: Payer: BLUE CROSS/BLUE SHIELD | Admitting: Gastroenterology

## 2015-05-06 ENCOUNTER — Encounter: Payer: Self-pay | Admitting: Internal Medicine

## 2015-05-06 ENCOUNTER — Ambulatory Visit (INDEPENDENT_AMBULATORY_CARE_PROVIDER_SITE_OTHER): Payer: BLUE CROSS/BLUE SHIELD | Admitting: Internal Medicine

## 2015-05-06 ENCOUNTER — Other Ambulatory Visit: Payer: Self-pay | Admitting: Emergency Medicine

## 2015-05-06 VITALS — BP 134/86 | HR 64 | Temp 98.5°F | Resp 16 | Ht 62.0 in | Wt 152.0 lb

## 2015-05-06 DIAGNOSIS — J069 Acute upper respiratory infection, unspecified: Secondary | ICD-10-CM

## 2015-05-06 MED ORDER — AZITHROMYCIN 250 MG PO TABS
ORAL_TABLET | ORAL | Status: DC
Start: 2015-05-06 — End: 2015-05-31

## 2015-05-06 MED ORDER — AZITHROMYCIN 250 MG PO TABS
ORAL_TABLET | ORAL | Status: DC
Start: 2015-05-06 — End: 2015-05-06

## 2015-05-06 NOTE — Progress Notes (Signed)
    Subjective:    Patient ID: Kayla GreenLois R Barnett, female    DOB: 08/02/1960, 54 y.o.   MRN: [id number]  HPI   Her cold symptoms started one week ago.  Her symptoms are not improving and are worse the past few days.  She feels horrible.  She has had a fever at home up to 104.  She states fatigue, decreased appetite, nasal congestion, sore throat, productive cough, nausea/vomiting, muscles aches, headaches.  She has been taking advil and robitussin D.     Medications and allergies reviewed with patient and updated if appropriate.  Patient Active Problem List   Diagnosis Date Noted  . Routine general medical examination at a health care facility 12/28/2014    Past Medical History  Diagnosis Date  . Heart murmur     mild MR    Past Surgical History  Procedure Laterality Date  . Child birth  07/1993  . Tubal ligation  2005    Social History   Social History  . Marital Status: Single    Spouse Name: N/A  . Number of Children: N/A  . Years of Education: N/A   Social History Main Topics  . Smoking status: Never Smoker   . Smokeless tobacco: Never Used  . Alcohol Use: 0.0 oz/week    0 Standard drinks or equivalent per week     Comment: monthly wine, beer or liquor  . Drug Use: No  . Sexual Activity: Not on file   Other Topics Concern  . Not on file   Social History Narrative    Review of Systems  Constitutional: Positive for fever (104), appetite change (decreased) and fatigue.  HENT: Positive for congestion, hearing loss and sore throat. Negative for ear pain and sinus pressure.   Respiratory: Positive for cough (productive). Negative for shortness of breath and wheezing.   Cardiovascular: Negative for chest pain.  Gastrointestinal: Positive for nausea and vomiting. Negative for abdominal pain and diarrhea.  Musculoskeletal: Positive for myalgias.  Neurological: Positive for headaches. Negative for dizziness and light-headedness.       Objective:   Filed Vitals:     05/06/15 1104  BP: 134/86  Pulse: 64  Temp: 98.5 F (36.9 C)  Resp: 16   Filed Weights   05/06/15 1104  Weight: 152 lb (68.947 kg)   Body mass index is 27.79 kg/(m^2).   Physical Exam  Constitutional: She appears well-developed and well-nourished. No distress.  HENT:  Head: Normocephalic and atraumatic.  Right Ear: External ear normal.  Left Ear: External ear normal.  B/l ear canals with excessive cerumen  Eyes: Conjunctivae are normal.  Neck: Neck supple. No tracheal deviation present. No thyromegaly present.  Cardiovascular: Normal rate, regular rhythm and normal heart sounds.   No murmur heard. Pulmonary/Chest: Effort normal and breath sounds normal. No respiratory distress. She has no wheezes.  Musculoskeletal: She exhibits no edema.  Lymphadenopathy:    She has no cervical adenopathy.  Skin: Skin is warm and dry.        Assessment & Plan:   URI Concern for bacterial cause so I will start an antibiotic - zpak Stressed rest, fluids - note given for work Advil, take tylenol in between doses if needed Over-the-counter cold medications Continue Robitussin as needed Call if no improvement or symptoms worsen

## 2015-05-06 NOTE — Progress Notes (Signed)
Pre visit review using our clinic review tool, if applicable. No additional management support is needed unless otherwise documented below in the visit note. 

## 2015-05-06 NOTE — Patient Instructions (Signed)
Your antibiotic has been sent to your pharmacy.  Continue advil and cough syrup.  You can take tylenol in between your advil doses.  Increase rest and fluids.   You were given a note for work.  Upper Respiratory Infection, Adult Most upper respiratory infections (URIs) are a viral infection of the air passages leading to the lungs. A URI affects the nose, throat, and upper air passages. The most common type of URI is nasopharyngitis and is typically referred to as "the common cold." URIs run their course and usually go away on their own. Most of the time, a URI does not require medical attention, but sometimes a bacterial infection in the upper airways can follow a viral infection. This is called a secondary infection. Sinus and middle ear infections are common types of secondary upper respiratory infections. Bacterial pneumonia can also complicate a URI. A URI can worsen asthma and chronic obstructive pulmonary disease (COPD). Sometimes, these complications can require emergency medical care and may be life threatening.  CAUSES Almost all URIs are caused by viruses. A virus is a type of germ and can spread from one person to another.  RISKS FACTORS You may be at risk for a URI if:   You smoke.   You have chronic heart or lung disease.  You have a weakened defense (immune) system.   You are very young or very old.   You have nasal allergies or asthma.  You work in crowded or poorly ventilated areas.  You work in health care facilities or schools. SIGNS AND SYMPTOMS  Symptoms typically develop 2-3 days after you come in contact with a cold virus. Most viral URIs last 7-10 days. However, viral URIs from the influenza virus (flu virus) can last 14-18 days and are typically more severe. Symptoms may include:   Runny or stuffy (congested) nose.   Sneezing.   Cough.   Sore throat.   Headache.   Fatigue.   Fever.   Loss of appetite.   Pain in your forehead, behind  your eyes, and over your cheekbones (sinus pain).  Muscle aches.  DIAGNOSIS  Your health care provider may diagnose a URI by:  Physical exam.  Tests to check that your symptoms are not due to another condition such as:  Strep throat.  Sinusitis.  Pneumonia.  Asthma. TREATMENT  A URI goes away on its own with time. It cannot be cured with medicines, but medicines may be prescribed or recommended to relieve symptoms. Medicines may help:  Reduce your fever.  Reduce your cough.  Relieve nasal congestion. HOME CARE INSTRUCTIONS   Take medicines only as directed by your health care provider.   Gargle warm saltwater or take cough drops to comfort your throat as directed by your health care provider.  Use a warm mist humidifier or inhale steam from a shower to increase air moisture. This may make it easier to breathe.  Drink enough fluid to keep your urine clear or pale yellow.   Eat soups and other clear broths and maintain good nutrition.   Rest as needed.   Return to work when your temperature has returned to normal or as your health care provider advises. You may need to stay home longer to avoid infecting others. You can also use a face mask and careful hand washing to prevent spread of the virus.  Increase the usage of your inhaler if you have asthma.   Do not use any tobacco products, including cigarettes, chewing tobacco, or electronic  cigarettes. If you need help quitting, ask your health care provider. PREVENTION  The best way to protect yourself from getting a cold is to practice good hygiene.   Avoid oral or hand contact with people with cold symptoms.   Wash your hands often if contact occurs.  There is no clear evidence that vitamin C, vitamin E, echinacea, or exercise reduces the chance of developing a cold. However, it is always recommended to get plenty of rest, exercise, and practice good nutrition.  SEEK MEDICAL CARE IF:   You are getting worse  rather than better.   Your symptoms are not controlled by medicine.   You have chills.  You have worsening shortness of breath.  You have brown or red mucus.  You have yellow or brown nasal discharge.  You have pain in your face, especially when you bend forward.  You have a fever.  You have swollen neck glands.  You have pain while swallowing.  You have white areas in the back of your throat. SEEK IMMEDIATE MEDICAL CARE IF:   You have severe or persistent:  Headache.  Ear pain.  Sinus pain.  Chest pain.  You have chronic lung disease and any of the following:  Wheezing.  Prolonged cough.  Coughing up blood.  A change in your usual mucus.  You have a stiff neck.  You have changes in your:  Vision.  Hearing.  Thinking.  Mood. MAKE SURE YOU:   Understand these instructions.  Will watch your condition.  Will get help right away if you are not doing well or get worse.   This information is not intended to replace advice given to you by your health care provider. Make sure you discuss any questions you have with your health care provider.   Document Released: 12/09/2000 Document Revised: 10/30/2014 Document Reviewed: 09/20/2013 Elsevier Interactive Patient Education [employer].

## 2015-05-10 ENCOUNTER — Ambulatory Visit (AMBULATORY_SURGERY_CENTER): Payer: Self-pay

## 2015-05-10 VITALS — Ht 62.0 in | Wt 156.0 lb

## 2015-05-10 DIAGNOSIS — Z1211 Encounter for screening for malignant neoplasm of colon: Secondary | ICD-10-CM
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
## 2015-05-10 NOTE — Progress Notes (Signed)
No allergies to eggs or soy No past problems with anesthesia No home oxygen HAS STOPPED PHENTERMINE  Has email; has internet; refused emmi because she saw it "last time"

## 2015-05-13 ENCOUNTER — Telehealth: Payer: Self-pay | Admitting: *Deleted

## 2015-05-13 NOTE — Telephone Encounter (Signed)
She needs to come in - you should not need a refill of a zpak.  She needs to be re-evaluated.

## 2015-05-13 NOTE — Telephone Encounter (Signed)
Left msg on triage stating md told her to call back if she is not feeling any better. Pt states she feel a lil better, but still have cough. Wanting to get a refill on Zpac...Raechel Chute/lmb

## 2015-05-14 NOTE — Telephone Encounter (Signed)
Called pt no answer LMOM with md response.../lmb 

## 2015-05-17 ENCOUNTER — Emergency Department (HOSPITAL_COMMUNITY): Payer: BLUE CROSS/BLUE SHIELD

## 2015-05-17 ENCOUNTER — Emergency Department (HOSPITAL_COMMUNITY)
Admission: EM | Admit: 2015-05-17 | Discharge: 2015-05-17 | Disposition: A | Discharge status: Home / Self Care | Payer: BLUE CROSS/BLUE SHIELD | Attending: Emergency Medicine | Admitting: Emergency Medicine

## 2015-05-17 ENCOUNTER — Encounter (HOSPITAL_COMMUNITY): Payer: Self-pay | Admitting: Emergency Medicine

## 2015-05-17 DIAGNOSIS — S4992XA Unspecified injury of left shoulder and upper arm, initial encounter: Secondary | ICD-10-CM | POA: Diagnosis not present

## 2015-05-17 DIAGNOSIS — Y9389 Activity, other specified: Secondary | ICD-10-CM | POA: Diagnosis not present

## 2015-05-17 DIAGNOSIS — S3992XA Unspecified injury of lower back, initial encounter: Secondary | ICD-10-CM | POA: Insufficient documentation

## 2015-05-17 DIAGNOSIS — Z880 Allergy status to penicillin: Secondary | ICD-10-CM | POA: Insufficient documentation

## 2015-05-17 DIAGNOSIS — S29002A Unspecified injury of muscle and tendon of back wall of thorax, initial encounter: Secondary | ICD-10-CM | POA: Diagnosis not present

## 2015-05-17 DIAGNOSIS — S199XXA Unspecified injury of neck, initial encounter: Secondary | ICD-10-CM | POA: Insufficient documentation

## 2015-05-17 DIAGNOSIS — S8991XA Unspecified injury of right lower leg, initial encounter: Secondary | ICD-10-CM | POA: Diagnosis not present

## 2015-05-17 DIAGNOSIS — Y92210 Daycare center as the place of occurrence of the external cause: Secondary | ICD-10-CM | POA: Diagnosis not present

## 2015-05-17 DIAGNOSIS — R011 Cardiac murmur, unspecified: Secondary | ICD-10-CM | POA: Insufficient documentation

## 2015-05-17 DIAGNOSIS — S0990XA Unspecified injury of head, initial encounter: Secondary | ICD-10-CM | POA: Insufficient documentation

## 2015-05-17 DIAGNOSIS — Y998 Other external cause status: Secondary | ICD-10-CM | POA: Insufficient documentation

## 2015-05-17 LAB — I-STAT CHEM 8, ED
BUN: 14 mg/dL (ref 6–20)
CALCIUM ION: 1.21 mmol/L (ref 1.12–1.23)
CREATININE: 0.9 mg/dL (ref 0.44–1.00)
Chloride: 102 mmol/L (ref 101–111)
Glucose, Bld: 69 mg/dL (ref 65–99)
HEMATOCRIT: 42 % (ref 36.0–46.0)
Hemoglobin: 14.3 g/dL (ref 12.0–15.0)
Potassium: 3.6 mmol/L (ref 3.5–5.1)
Sodium: 141 mmol/L (ref 135–145)
TCO2: 27 mmol/L (ref 0–100)

## 2015-05-17 MED ORDER — NAPROXEN 500 MG PO TABS: 500.0000 mg | ORAL_TABLET | Freq: Two times a day (BID) | ORAL | Status: DC

## 2015-05-17 MED ORDER — CYCLOBENZAPRINE HCL 10 MG PO TABS
10.0000 mg | ORAL_TABLET | Freq: Two times a day (BID) | ORAL | Status: DC | PRN
Start: 2015-05-17 — End: 2016-04-10

## 2015-05-17 MED ORDER — HYDROCODONE-ACETAMINOPHEN 5-325 MG PO TABS
1.0000 | ORAL_TABLET | ORAL | Status: AC
  Administered 2015-05-17: 1 via ORAL
  Filled 2015-05-17: qty 1

## 2015-05-17 MED ORDER — IOHEXOL 300 MG/ML  SOLN
100.0000 mL | Freq: Once | INTRAMUSCULAR | Status: AC | PRN
  Administered 2015-05-17: 100 mL via INTRAVENOUS
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
## 2015-05-17 NOTE — Discharge Instructions (Signed)
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
## 2015-05-21 ENCOUNTER — Other Ambulatory Visit: Payer: Self-pay | Admitting: Internal Medicine

## 2015-05-22 ENCOUNTER — Encounter: Payer: Self-pay | Admitting: Gastroenterology

## 2015-05-31 ENCOUNTER — Encounter: Payer: Self-pay | Admitting: Gastroenterology

## 2015-05-31 ENCOUNTER — Ambulatory Visit (AMBULATORY_SURGERY_CENTER): Payer: BLUE CROSS/BLUE SHIELD | Admitting: Gastroenterology

## 2015-05-31 VITALS — BP 124/69 | HR 51 | Temp 97.6°F | Resp 18 | Ht 62.0 in | Wt 156.0 lb

## 2015-05-31 DIAGNOSIS — Z1211 Encounter for screening for malignant neoplasm of colon: Secondary | ICD-10-CM | POA: Diagnosis not present

## 2015-05-31 MED ORDER — SODIUM CHLORIDE 0.9 % IV SOLN: 500.0000 mL | INTRAVENOUS | Status: DC

## 2015-05-31 NOTE — Progress Notes (Signed)
Report to PACU, RN, vss, BBS= Clear.  

## 2015-05-31 NOTE — Patient Instructions (Signed)
YOU HAD AN ENDOSCOPIC PROCEDURE TODAY AT THE [facility]:   Refer to the procedure report that was given to you for any specific questions about what was found during the examination.  If the procedure report does not answer your questions, please call your gastroenterologist to clarify.  If you requested that your care partner not be given the details of your procedure findings, then the procedure report has been included in a sealed envelope for you to review at your convenience later.  YOU SHOULD EXPECT: Some feelings of bloating in the abdomen. Passage of more gas than usual.  Walking can help get rid of the air that was put into your GI tract during the procedure and reduce the bloating. If you had a lower endoscopy (such as a colonoscopy or flexible sigmoidoscopy) you may notice spotting of blood in your stool or on the toilet paper. If you underwent a bowel prep for your procedure, you may not have a normal bowel movement for a few days.  Please Note:  You might notice some irritation and congestion in your nose or some drainage.  This is from the oxygen used during your procedure.  There is no need for concern and it should clear up in a day or so.  SYMPTOMS TO REPORT IMMEDIATELY:   Following lower endoscopy (colonoscopy or flexible sigmoidoscopy):  Excessive amounts of blood in the stool  Significant tenderness or worsening of abdominal pains  Swelling of the abdomen that is new, acute  Fever of 100F or higher  For urgent or emergent issues, a gastroenterologist can be reached at any hour by calling [phone].   DIET: Your first meal following the procedure should be a small meal and then it is ok to progress to your normal diet. Heavy or fried foods are harder to digest and may make you feel nauseous or bloated.  Likewise, meals heavy in dairy and vegetables can increase bloating.  Drink plenty of fluids but you should avoid alcoholic beverages for 24  hours.  ACTIVITY:  You should plan to take it easy for the rest of today and you should NOT DRIVE or use heavy machinery until tomorrow (because of the sedation medicines used during the test).    FOLLOW UP: Our staff will call the number listed on your records the next business day following your procedure to check on you and address any questions or concerns that you may have regarding the information given to you following your procedure. If we do not reach you, we will leave a message.  However, if you are feeling well and you are not experiencing any problems, there is no need to return our call.  We will assume that you have returned to your regular daily activities without incident.  If any biopsies were taken you will be contacted by phone or by letter within the next 1-3 weeks.  Please call us at [phone] if you have not heard about the biopsies in 3 weeks.    SIGNATURES/CONFIDENTIALITY: You and/or your care partner have signed paperwork which will be entered into your electronic medical record.  These signatures attest to the fact that that the information above on your After Visit Summary has been reviewed and is understood.  Full responsibility of the confidentiality of this discharge information lies with you and/or your care-partner.  Next colonoscopy in 10 years. 

## 2015-05-31 NOTE — Op Note (Signed)
[facility] [address] [place], [id number]   COLONOSCOPY PROCEDURE REPORT  PATIENT: Kayla Barnett, Kayla Barnett  MR#: [id number] BIRTHDATE: 1961-05-27 , 54  yrs. old GENDER: female ENDOSCOPIST: Rachael Feeaniel P Erandi Lemma, MD REFERRED BY: Hillard DankerElizabeth Crawford, MD PROCEDURE DATE:  05/31/2015 PROCEDURE:   Colonoscopy, screening First Screening Colonoscopy - Avg.  risk and is 50 yrs.  old or older Yes.  Prior Negative Screening - Now for repeat screening. N/A  History of Adenoma - Now for follow-up colonoscopy & has been > or = to 3 yrs.  N/A  Recommend repeat exam, <10 yrs? No ASA CLASS:   Class II INDICATIONS:Screening for colonic neoplasia and Colorectal Neoplasm Risk Assessment for this procedure is average risk. MEDICATIONS: Monitored anesthesia care and Propofol 200 mg IV  DESCRIPTION OF PROCEDURE:   After the risks benefits and alternatives of the procedure were thoroughly explained, informed consent was obtained.  The digital rectal exam revealed no abnormalities of the rectum.   The LB [id number]  endoscope was introduced through the anus and advanced to the cecum, which was identified by both the appendix and ileocecal valve. No adverse events experienced.   The quality of the prep was excellent.  The instrument was then slowly withdrawn as the colon was fully examined. Estimated blood loss is zero unless otherwise noted in this procedure report.   COLON FINDINGS: A normal appearing cecum, ileocecal valve, and appendiceal orifice were identified.  The ascending, transverse, descending, sigmoid colon, and rectum appeared unremarkable. Retroflexed views revealed no abnormalities. The time to cecum = 3.7 Withdrawal time = 6.2   The scope was withdrawn and the procedure completed. COMPLICATIONS: There were no immediate complications.  ENDOSCOPIC IMPRESSION: Normal colonoscopy NO polyps or cancers  RECOMMENDATIONS: You should continue to follow colorectal cancer  screening guidelines for "routine risk" patients with a repeat colonoscopy in 10 years.   eSigned:  Rachael Feeaniel P Garwood Wentzell, MD 05/31/2015 3:04 PM

## 2015-06-03 ENCOUNTER — Telehealth: Payer: Self-pay | Admitting: *Deleted

## 2015-06-03 NOTE — Telephone Encounter (Signed)
  Follow up Call-  Call back number 05/31/2015  Post procedure Call Back phone  # [phone]  Permission to leave phone message Yes     Patient questions:  Do you have a fever, pain , or abdominal swelling? No. Pain Score  0 *  Have you tolerated food without any problems? Yes.    Have you been able to return to your normal activities? Yes.    Do you have any questions about your discharge instructions: Diet   No. Medications  No. Follow up visit  No.  Do you have questions or concerns about your Care? No.  Actions: * If pain score is 4 or above: No action needed, pain <4.

## 2016-01-01 ENCOUNTER — Other Ambulatory Visit: Payer: Self-pay | Admitting: Internal Medicine

## 2016-02-24 ENCOUNTER — Other Ambulatory Visit: Payer: Self-pay | Admitting: Internal Medicine

## 2016-03-24 ENCOUNTER — Encounter: Payer: BLUE CROSS/BLUE SHIELD | Admitting: Internal Medicine

## 2016-04-10 ENCOUNTER — Encounter: Payer: Self-pay | Admitting: Internal Medicine

## 2016-04-10 ENCOUNTER — Ambulatory Visit (INDEPENDENT_AMBULATORY_CARE_PROVIDER_SITE_OTHER): Payer: Managed Care, Other (non HMO) | Admitting: Internal Medicine

## 2016-04-10 ENCOUNTER — Other Ambulatory Visit (INDEPENDENT_AMBULATORY_CARE_PROVIDER_SITE_OTHER): Payer: Managed Care, Other (non HMO)

## 2016-04-10 VITALS — BP 130/78 | HR 62 | Temp 98.3°F | Resp 16 | Ht 62.0 in | Wt 169.8 lb

## 2016-04-10 DIAGNOSIS — Z1159 Encounter for screening for other viral diseases: Secondary | ICD-10-CM | POA: Diagnosis not present

## 2016-04-10 DIAGNOSIS — Z0000 Encounter for general adult medical examination without abnormal findings: Secondary | ICD-10-CM

## 2016-04-10 LAB — LIPID PANEL
CHOLESTEROL: 195 mg/dL (ref 0–200)
HDL: 71.9 mg/dL (ref >39.00)
LDL CALC: 93 mg/dL (ref 0–99)
NonHDL: 123.53
TRIGLYCERIDES: 154 mg/dL — AB (ref 0.0–149.0)
Total CHOL/HDL Ratio: 3
VLDL: 30.8 mg/dL (ref 0.0–40.0)

## 2016-04-10 LAB — CBC
HCT: 39 % (ref 36.0–46.0)
Hemoglobin: 13 g/dL (ref 12.0–15.0)
MCHC: 33.4 g/dL (ref 30.0–36.0)
MCV: 90.6 fl (ref 78.0–100.0)
PLATELETS: 166 10*3/uL (ref 150.0–400.0)
RBC: 4.31 Mil/uL (ref 3.87–5.11)
RDW: 14.2 % (ref 11.5–15.5)
WBC: 3.6 10*3/uL — AB (ref 4.0–10.5)

## 2016-04-10 LAB — COMPREHENSIVE METABOLIC PANEL
ALBUMIN: 4.2 g/dL (ref 3.5–5.2)
ALK PHOS: 74 U/L (ref 39–117)
ALT: 13 U/L (ref 0–35)
AST: 20 U/L (ref 0–37)
BILIRUBIN TOTAL: 0.7 mg/dL (ref 0.2–1.2)
BUN: 14 mg/dL (ref 6–23)
CALCIUM: 9.6 mg/dL (ref 8.4–10.5)
CO2: 32 mEq/L (ref 19–32)
CREATININE: 0.91 mg/dL (ref 0.40–1.20)
Chloride: 108 mEq/L (ref 96–112)
GFR: 82.69 mL/min (ref >60.00)
Glucose, Bld: 97 mg/dL (ref 70–99)
Potassium: 4.2 mEq/L (ref 3.5–5.1)
Sodium: 146 mEq/L — ABNORMAL HIGH (ref 135–145)
Total Protein: 7.1 g/dL (ref 6.0–8.3)

## 2016-04-10 LAB — VITAMIN D 25 HYDROXY (VIT D DEFICIENCY, FRACTURES): VITD: 13.47 ng/mL — ABNORMAL LOW (ref 30.00–100.00)

## 2016-04-10 MED ORDER — PHENTERMINE-TOPIRAMATE ER 7.5-46 MG PO CP24: 1.0000 | ORAL_CAPSULE | Freq: Every day | ORAL | 1 refills | Status: DC

## 2016-04-10 NOTE — Patient Instructions (Signed)
We are checking the labs today and have cleaned out your ears.   We have given you the prescription for the weight loss medicine.  Health Maintenance, Female Adopting a healthy lifestyle and getting preventive care can go a long way to promote health and wellness. Talk with your health care provider about what schedule of regular examinations is right for you. This is a good chance for you to check in with your provider about disease prevention and staying healthy. In between checkups, there are plenty of things you can do on your own. Experts have done a lot of research about which lifestyle changes and preventive measures are most likely to keep you healthy. Ask your health care provider for more information. WEIGHT AND DIET  Eat a healthy diet  Be sure to include plenty of vegetables, fruits, low-fat dairy products, and lean protein.  Do not eat a lot of foods high in solid fats, added sugars, or salt.  Get regular exercise. This is one of the most important things you can do for your health.  Most adults should exercise for at least 150 minutes each week. The exercise should increase your heart rate and make you sweat (moderate-intensity exercise).  Most adults should also do strengthening exercises at least twice a week. This is in addition to the moderate-intensity exercise.  Maintain a healthy weight  Body mass index (BMI) is a measurement that can be used to identify possible weight problems. It estimates body fat based on height and weight. Your health care provider can help determine your BMI and help you achieve or maintain a healthy weight.  For females 28 years of age and older:   A BMI below 18.5 is considered underweight.  A BMI of 18.5 to 24.9 is normal.  A BMI of 25 to 29.9 is considered overweight.  A BMI of 30 and above is considered obese.  Watch levels of cholesterol and blood lipids  You should start having your blood tested for lipids and cholesterol at 54  years of age, then have this test every 5 years.  You may need to have your cholesterol levels checked more often if:  Your lipid or cholesterol levels are high.  You are older than 54 years of age.  You are at high risk for heart disease.  CANCER SCREENING   Lung Cancer  Lung cancer screening is recommended for adults 70-26 years old who are at high risk for lung cancer because of a history of smoking.  A yearly low-dose CT scan of the lungs is recommended for people who:  Currently smoke.  Have quit within the past 15 years.  Have at least a 30-pack-year history of smoking. A pack year is smoking an average of one pack of cigarettes a day for 1 year.  Yearly screening should continue until it has been 15 years since you quit.  Yearly screening should stop if you develop a health problem that would prevent you from having lung cancer treatment.  Breast Cancer  Practice breast self-awareness. This means understanding how your breasts normally appear and feel.  It also means doing regular breast self-exams. Let your health care provider know about any changes, no matter how small.  If you are in your 20s or 30s, you should have a clinical breast exam (CBE) by a health care provider every 1-3 years as part of a regular health exam.  If you are 62 or older, have a CBE every year. Also consider having a breast  X-ray (mammogram) every year.  If you have a family history of breast cancer, talk to your health care provider about genetic screening.  If you are at high risk for breast cancer, talk to your health care provider about having an MRI and a mammogram every year.  Breast cancer gene (BRCA) assessment is recommended for women who have family members with BRCA-related cancers. BRCA-related cancers include:  Breast.  Ovarian.  Tubal.  Peritoneal cancers.  Results of the assessment will determine the need for genetic counseling and BRCA1 and BRCA2 testing. Cervical  Cancer Your health care provider may recommend that you be screened regularly for cancer of the pelvic organs (ovaries, uterus, and vagina). This screening involves a pelvic examination, including checking for microscopic changes to the surface of your cervix (Pap test). You may be encouraged to have this screening done every 3 years, beginning at age 59.  For women ages 49-65, health care providers may recommend pelvic exams and Pap testing every 3 years, or they may recommend the Pap and pelvic exam, combined with testing for human papilloma virus (HPV), every 5 years. Some types of HPV increase your risk of cervical cancer. Testing for HPV may also be done on women of any age with unclear Pap test results.  Other health care providers may not recommend any screening for nonpregnant women who are considered low risk for pelvic cancer and who do not have symptoms. Ask your health care provider if a screening pelvic exam is right for you.  If you have had past treatment for cervical cancer or a condition that could lead to cancer, you need Pap tests and screening for cancer for at least 20 years after your treatment. If Pap tests have been discontinued, your risk factors (such as having a new sexual partner) need to be reassessed to determine if screening should resume. Some women have medical problems that increase the chance of getting cervical cancer. In these cases, your health care provider may recommend more frequent screening and Pap tests. Colorectal Cancer  This type of cancer can be detected and often prevented.  Routine colorectal cancer screening usually begins at 54 years of age and continues through 54 years of age.  Your health care provider may recommend screening at an earlier age if you have risk factors for colon cancer.  Your health care provider may also recommend using home test kits to check for hidden blood in the stool.  A small camera at the end of a tube can be used to  examine your colon directly (sigmoidoscopy or colonoscopy). This is done to check for the earliest forms of colorectal cancer.  Routine screening usually begins at age 12.  Direct examination of the colon should be repeated every 5-10 years through 54 years of age. However, you may need to be screened more often if early forms of precancerous polyps or small growths are found. Skin Cancer  Check your skin from head to toe regularly.  Tell your health care provider about any new moles or changes in moles, especially if there is a change in a mole's shape or color.  Also tell your health care provider if you have a mole that is larger than the size of a pencil eraser.  Always use sunscreen. Apply sunscreen liberally and repeatedly throughout the day.  Protect yourself by wearing long sleeves, pants, a wide-brimmed hat, and sunglasses whenever you are outside. HEART DISEASE, DIABETES, AND HIGH BLOOD PRESSURE   High blood pressure causes heart  disease and increases the risk of stroke. High blood pressure is more likely to develop in:  People who have blood pressure in the high end of the normal range (130-139/85-89 mm Hg).  People who are overweight or obese.  People who are African American.  If you are 46-25 years of age, have your blood pressure checked every 3-5 years. If you are 69 years of age or older, have your blood pressure checked every year. You should have your blood pressure measured twice--once when you are at a hospital or clinic, and once when you are not at a hospital or clinic. Record the average of the two measurements. To check your blood pressure when you are not at a hospital or clinic, you can use:  An automated blood pressure machine at a pharmacy.  A home blood pressure monitor.  If you are between 82 years and 75 years old, ask your health care provider if you should take aspirin to prevent strokes.  Have regular diabetes screenings. This involves taking a  blood sample to check your fasting blood sugar level.  If you are at a normal weight and have a low risk for diabetes, have this test once every three years after 54 years of age.  If you are overweight and have a high risk for diabetes, consider being tested at a younger age or more often. PREVENTING INFECTION  Hepatitis B  If you have a higher risk for hepatitis B, you should be screened for this virus. You are considered at high risk for hepatitis B if:  You were born in a country where hepatitis B is common. Ask your health care provider which countries are considered high risk.  Your parents were born in a high-risk country, and you have not been immunized against hepatitis B (hepatitis B vaccine).  You have HIV or AIDS.  You use needles to inject street drugs.  You live with someone who has hepatitis B.  You have had sex with someone who has hepatitis B.  You get hemodialysis treatment.  You take certain medicines for conditions, including cancer, organ transplantation, and autoimmune conditions. Hepatitis C  Blood testing is recommended for:  Everyone born from [birth year] through 1964.  Anyone with known risk factors for hepatitis C. Sexually transmitted infections (STIs)  You should be screened for sexually transmitted infections (STIs) including gonorrhea and chlamydia if:  You are sexually active and are younger than 54 years of age.  You are older than 54 years of age and your health care provider tells you that you are at risk for this type of infection.  Your sexual activity has changed since you were last screened and you are at an increased risk for chlamydia or gonorrhea. Ask your health care provider if you are at risk.  If you do not have HIV, but are at risk, it may be recommended that you take a prescription medicine daily to prevent HIV infection. This is called pre-exposure prophylaxis (PrEP). You are considered at risk if:  You are sexually active and do  not regularly use condoms or know the HIV status of your partner(s).  You take drugs by injection.  You are sexually active with a partner who has HIV. Talk with your health care provider about whether you are at high risk of being infected with HIV. If you choose to begin PrEP, you should first be tested for HIV. You should then be tested every 3 months for as long as you are taking  PrEP.  PREGNANCY   If you are premenopausal and you may become pregnant, ask your health care provider about preconception counseling.  If you may become pregnant, take 400 to 800 micrograms (mcg) of folic acid every day.  If you want to prevent pregnancy, talk to your health care provider about birth control (contraception). OSTEOPOROSIS AND MENOPAUSE   Osteoporosis is a disease in which the bones lose minerals and strength with aging. This can result in serious bone fractures. Your risk for osteoporosis can be identified using a bone density scan.  If you are 12 years of age or older, or if you are at risk for osteoporosis and fractures, ask your health care provider if you should be screened.  Ask your health care provider whether you should take a calcium or vitamin D supplement to lower your risk for osteoporosis.  Menopause may have certain physical symptoms and risks.  Hormone replacement therapy may reduce some of these symptoms and risks. Talk to your health care provider about whether hormone replacement therapy is right for you.  HOME CARE INSTRUCTIONS   Schedule regular health, dental, and eye exams.  Stay current with your immunizations.   Do not use any tobacco products including cigarettes, chewing tobacco, or electronic cigarettes.  If you are pregnant, do not drink alcohol.  If you are breastfeeding, limit how much and how often you drink alcohol.  Limit alcohol intake to no more than 1 drink per day for nonpregnant women. One drink equals 12 ounces of beer, 5 ounces of wine, or 1  ounces of hard liquor.  Do not use street drugs.  Do not share needles.  Ask your health care provider for help if you need support or information about quitting drugs.  Tell your health care provider if you often feel depressed.  Tell your health care provider if you have ever been abused or do not feel safe at home.   This information is not intended to replace advice given to you by your health care provider. Make sure you discuss any questions you have with your health care provider.   Document Released: 12/29/2010 Document Revised: 07/06/2014 Document Reviewed: 05/17/2013 Elsevier Interactive Patient Education [employer].

## 2016-04-10 NOTE — Assessment & Plan Note (Addendum)
Checking labs, refilled med. Checking hep c screening. Colonoscopy done in 2016 and not due again until 2026. Counseled on the dangers of distracted driving and sun safety with mole surveillance. Given screening recommendations.

## 2016-04-10 NOTE — Progress Notes (Signed)
Pre visit review using our clinic review tool, if applicable. No additional management support is needed unless otherwise documented below in the visit note. 

## 2016-04-10 NOTE — Progress Notes (Signed)
   Subjective:    Patient ID: Kayla Barnett, female    DOB: 11/26/1960, 55 y.o.   MRN: [id number]  HPI The patient is a 55 YO female coming in for wellness. No new concerns.   [facility], [facility] reviewed and updated.   Review of Systems  Constitutional: Negative.   HENT: Negative.   Eyes: Negative.   Respiratory: Negative.   Cardiovascular: Negative.   Gastrointestinal: Negative.   Musculoskeletal: Negative.   Skin: Negative.   Neurological: Negative.   Psychiatric/Behavioral: Negative.       Objective:   Physical Exam  Constitutional: She is oriented to person, place, and time. She appears well-developed and well-nourished.  HENT:  Head: Normocephalic and atraumatic.  Eyes: EOM are normal.  Neck: Normal range of motion.  Cardiovascular: Normal rate and regular rhythm.   Pulmonary/Chest: Effort normal and breath sounds normal. No respiratory distress. She has no wheezes. She has no rales.  Abdominal: Soft. Bowel sounds are normal. She exhibits no distension. There is no tenderness. There is no rebound.  Musculoskeletal: She exhibits no edema.  Neurological: She is alert and oriented to person, place, and time. Coordination normal.  Skin: Skin is warm and dry.  Psychiatric: She has a normal mood and affect.   Vitals:   04/10/16 1500  BP: 130/78  Pulse: 62  Resp: 16  Temp: 98.3 F (36.8 C)  TempSrc: Oral  SpO2: 98%  Weight: 169 lb 12.8 oz (77 kg)  Height: 5\' 2"  (1.575 m)      Assessment & Plan:

## 2016-04-11 LAB — HEPATITIS C ANTIBODY: HCV AB: NEGATIVE

## 2016-04-15 ENCOUNTER — Telehealth: Payer: Self-pay | Admitting: Internal Medicine

## 2016-04-15 NOTE — Telephone Encounter (Signed)
Patient in about her lab results. I informed her of the notes.

## 2016-04-15 NOTE — Telephone Encounter (Signed)
Noted  

## 2016-06-01 ENCOUNTER — Telehealth: Payer: Self-pay

## 2016-06-01 NOTE — Telephone Encounter (Signed)
Pt lmom and stated that she needed a refill of the phentermine-toprimate.   #90 was sent in October with refill.   Contacted pt and left detailed message regarding same.

## 2016-06-18 ENCOUNTER — Ambulatory Visit (INDEPENDENT_AMBULATORY_CARE_PROVIDER_SITE_OTHER): Payer: Managed Care, Other (non HMO)

## 2016-06-18 DIAGNOSIS — Z23 Encounter for immunization: Secondary | ICD-10-CM | POA: Diagnosis not present

## 2016-11-24 ENCOUNTER — Telehealth: Payer: Self-pay | Admitting: *Deleted

## 2016-11-24 NOTE — Telephone Encounter (Signed)
Pt left msg on triage requesting refill on Phentermine-Topiramate. Pt last saw MD 03/2016 gave 6 month supply will need f/u appt for refills. Pls call pt to make appt...Raechel Chute/lmb

## 2016-11-25 NOTE — Telephone Encounter (Signed)
Left patient vm to call back to make ov.

## 2016-12-01 ENCOUNTER — Encounter: Payer: Self-pay | Admitting: Internal Medicine

## 2016-12-10 ENCOUNTER — Ambulatory Visit: Payer: Managed Care, Other (non HMO) | Admitting: Internal Medicine

## 2016-12-11 ENCOUNTER — Encounter: Payer: Managed Care, Other (non HMO) | Admitting: Nurse Practitioner

## 2016-12-11 ENCOUNTER — Ambulatory Visit: Payer: Managed Care, Other (non HMO) | Admitting: Nurse Practitioner

## 2016-12-25 ENCOUNTER — Ambulatory Visit (INDEPENDENT_AMBULATORY_CARE_PROVIDER_SITE_OTHER): Payer: Managed Care, Other (non HMO) | Admitting: Nurse Practitioner

## 2016-12-25 ENCOUNTER — Encounter: Payer: Self-pay | Admitting: Nurse Practitioner

## 2016-12-25 VITALS — BP 122/78 | HR 60 | Temp 98.0°F | Ht 62.0 in | Wt 164.0 lb

## 2016-12-25 DIAGNOSIS — E669 Obesity, unspecified: Secondary | ICD-10-CM | POA: Diagnosis not present

## 2016-12-25 DIAGNOSIS — Z6830 Body mass index (BMI) 30.0-30.9, adult: Secondary | ICD-10-CM | POA: Diagnosis not present

## 2016-12-25 DIAGNOSIS — Z713 Dietary counseling and surveillance: Secondary | ICD-10-CM

## 2016-12-25 MED ORDER — PHENTERMINE-TOPIRAMATE ER 7.5-46 MG PO CP24: 1.0000 | ORAL_CAPSULE | Freq: Every day | ORAL | 1 refills | Status: DC

## 2016-12-25 NOTE — Patient Instructions (Signed)
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
## 2016-12-25 NOTE — Progress Notes (Signed)
Pre visit review using our clinic review tool, if applicable. No additional management support is needed unless otherwise documented below in the visit note. 

## 2017-02-13 ENCOUNTER — Emergency Department (HOSPITAL_COMMUNITY)
Admission: EM | Admit: 2017-02-13 | Discharge: 2017-02-13 | Disposition: A | Discharge status: Home / Self Care | Payer: Managed Care, Other (non HMO) | Attending: Emergency Medicine | Admitting: Emergency Medicine

## 2017-02-13 ENCOUNTER — Encounter (HOSPITAL_COMMUNITY): Payer: Self-pay

## 2017-02-13 ENCOUNTER — Emergency Department (HOSPITAL_COMMUNITY): Payer: Managed Care, Other (non HMO)

## 2017-02-13 DIAGNOSIS — Y9301 Activity, walking, marching and hiking: Secondary | ICD-10-CM | POA: Diagnosis not present

## 2017-02-13 DIAGNOSIS — Z79899 Other long term (current) drug therapy: Secondary | ICD-10-CM | POA: Diagnosis not present

## 2017-02-13 DIAGNOSIS — W19XXXA Unspecified fall, initial encounter: Secondary | ICD-10-CM

## 2017-02-13 DIAGNOSIS — Z23 Encounter for immunization: Secondary | ICD-10-CM | POA: Insufficient documentation

## 2017-02-13 DIAGNOSIS — Y999 Unspecified external cause status: Secondary | ICD-10-CM | POA: Diagnosis not present

## 2017-02-13 DIAGNOSIS — M25552 Pain in left hip: Secondary | ICD-10-CM

## 2017-02-13 DIAGNOSIS — Y92513 Shop (commercial) as the place of occurrence of the external cause: Secondary | ICD-10-CM | POA: Insufficient documentation

## 2017-02-13 DIAGNOSIS — T148XXA Other injury of unspecified body region, initial encounter: Secondary | ICD-10-CM

## 2017-02-13 DIAGNOSIS — S90512A Abrasion, left ankle, initial encounter: Secondary | ICD-10-CM | POA: Insufficient documentation

## 2017-02-13 DIAGNOSIS — W1830XA Fall on same level, unspecified, initial encounter: Secondary | ICD-10-CM | POA: Insufficient documentation

## 2017-02-13 DIAGNOSIS — M79672 Pain in left foot: Secondary | ICD-10-CM

## 2017-02-13 DIAGNOSIS — S8992XA Unspecified injury of left lower leg, initial encounter: Secondary | ICD-10-CM

## 2017-02-13 MED ORDER — NAPROXEN 375 MG PO TABS
375.0000 mg | ORAL_TABLET | Freq: Once | ORAL | Status: AC
  Administered 2017-02-13: 375 mg via ORAL
  Filled 2017-02-13: qty 1

## 2017-02-13 MED ORDER — BACITRACIN ZINC 500 UNIT/GM EX OINT
TOPICAL_OINTMENT | Freq: Two times a day (BID) | CUTANEOUS | Status: DC
  Administered 2017-02-13: 12:00:00 via TOPICAL
  Filled 2017-02-13: qty 0.9

## 2017-02-13 MED ORDER — TETANUS-DIPHTH-ACELL PERTUSSIS 5-2.5-18.5 LF-MCG/0.5 IM SUSP
0.5000 mL | Freq: Once | INTRAMUSCULAR | Status: AC
  Administered 2017-02-13: 0.5 mL via INTRAMUSCULAR
  Filled 2017-02-13: qty 0.5

## 2017-02-13 MED ORDER — NAPROXEN 375 MG PO TABS: 375.0000 mg | ORAL_TABLET | Freq: Two times a day (BID) | ORAL | 0 refills | Status: DC

## 2017-02-13 MED ORDER — CYCLOBENZAPRINE HCL 10 MG PO TABS: 10.0000 mg | ORAL_TABLET | Freq: Two times a day (BID) | ORAL | 0 refills | Status: DC | PRN

## 2017-02-13 NOTE — ED Provider Notes (Signed)
[facility]-EMERGENCY DEPT Provider Note   CSN: [id number] Arrival date & time: 02/13/17  1324     History   Chief Complaint Chief Complaint  Patient presents with  . Fall  . Knee Pain  . Abrasion  . Hip Pain    HPI Kayla Barnett is a 56 y.o. female.  HPI 56 year old African-American female with no significant past medical history presents to the emergency Department today for evaluation of pain related to mechanical fall prior to arrival. Patient states that she was walking to the grocery store this morning when she slipped on a wet spot causing her to fall down and landed onto her left hip and hit her left knee. Patient also states that she scraped her left ankle on the shopping cart before it hit the floor. Patient denies LOC or head injury. States that she has been ambulatory since the event. The patient has not taken any for pain prior to arrival. Moving and walking makes the pain worse. Holding still makes the pain better. Patient states her tetanus status not up-to-date. She denies any headache, vision changes, neck pain, lightheadedness, dizziness, abdominal pain, nausea, emesis, back pain, urinary retention, saddle paresthesias, loss of bowel bladder, lower x-ray paresthesias.  Past Medical History:  Diagnosis Date  . Heart murmur    mild MR    Patient Active Problem List   Diagnosis Date Noted  . Routine general medical examination at a health care facility 12/28/2014    Past Surgical History:  Procedure Laterality Date  . child birth  07/1993  . CYSTECTOMY     on back 25-30 yrs ago; today is 2016  . TUBAL LIGATION  2005    OB History    No data available       Home Medications    Prior to Admission medications   Medication Sig Start Date End Date Taking? Authorizing Provider  cyclobenzaprine (FLEXERIL) 10 MG tablet Take 1 tablet (10 mg total) by mouth 2 (two) times daily as needed for muscle spasms. 02/13/17   Rise Mu, PA-C  ibuprofen  (ADVIL,MOTRIN) 600 MG tablet TAKE 1 TABLET BY MOUTH EVERY 6 HOURS AS NEEDED 02/25/16   Myrlene Broker, MD  naproxen (NAPROSYN) 375 MG tablet Take 1 tablet (375 mg total) by mouth 2 (two) times daily. 02/13/17   Demetrios Loll T, PA-C  Phentermine-Topiramate 7.5-46 MG CP24 Take 1 tablet by mouth daily. 12/25/16   Nche, Bonna Gains, NP    Family History Family History  Problem Relation Age of Onset  . Colon cancer Neg Hx   . Colon polyps Neg Hx     Social History Social History  Substance Use Topics  . Smoking status: Never Smoker  . Smokeless tobacco: Never Used  . Alcohol use 0.0 oz/week     Comment: occasional     Allergies   Penicillins   Review of Systems Review of Systems  Constitutional: Negative for chills and fever.  Cardiovascular: Negative for chest pain.  Gastrointestinal: Negative for abdominal pain, nausea and vomiting.  Musculoskeletal: Positive for arthralgias and myalgias. Negative for back pain, gait problem, joint swelling, neck pain and neck stiffness.  Skin: Negative for color change.  Neurological: Negative for dizziness, weakness, light-headedness, numbness and headaches.  Hematological: Adenopathy:      Physical Exam Updated Vital Signs BP (!) 131/56 (BP Location: Left Arm)   Pulse (!) 54   Temp 98.2 F (36.8 C) (Oral)   Resp 16   Ht 5\' 2"  (1.575  m)   Wt 74.8 kg (165 lb)   SpO2 99%   BMI 30.18 kg/m   Physical Exam Physical Exam  Constitutional: Pt is oriented to person, place, and time. Appears well-developed and well-nourished. No distress.  HENT:  Head: Normocephalic and atraumatic.  Ears: No bilateral hemotympanum. Nose: Nose normal. No septal hematoma. Mouth/Throat: Uvula is midline, oropharynx is clear and moist and mucous membranes are normal.  Eyes: Conjunctivae and EOM are normal. Pupils are equal, round, and reactive to light.  Neck: No spinous process tenderness and no muscular tenderness present. No rigidity. Normal  range of motion present.  Full ROM without pain No midline cervical tenderness No crepitus, deformity or step-offs No paraspinal tenderness  Cardiovascular: Normal rate, regular rhythm and intact distal pulses.   Pulses:      Radial pulses are 2+ on the right side, and 2+ on the left side.       Dorsalis pedis pulses are 2+ on the right side, and 2+ on the left side.       Posterior tibial pulses are 2+ on the right side, and 2+ on the left side.  Pulmonary/Chest: Effort normal and breath sounds normal. No accessory muscle usage. No respiratory distress. No decreased breath sounds. No wheezes. No rhonchi. No rales. Exhibits no tenderness and no bony tenderness.   No flail segment, crepitus or deformity Equal chest expansion  Abdominal: Soft. Normal appearance and bowel sounds are normal. There is no tenderness. There is no rigidity, no guarding and no CVA tenderness.   Abd soft and nontender  Musculoskeletal: Normal range of motion.       Thoracic back: Exhibits normal range of motion.       Lumbar back: Exhibits normal range of motion.  Full range of motion of the T-spine and L-spine No tenderness to palpation of the spinous processes of the T-spine or L-spine No crepitus, deformity or step-offs No tenderness to palpation of the paraspinous muscles of the L-spine  Patient with pain to palpation left SI joint. No obvious ecchymosis, edema, erythema, deformity noted. Full range of motion. Patient able to ambulate with a slight limp.  Patient is tender to palpation of the medial aspect of the left knee joint. No joint laxity noted with valgus, varus stress, anterior drawer test. Patella tracks normally. Full range of motion. No obvious edema, ecchymosis, erythema, deformity noted.  Small abrasion noted to the left medial foot. Bleeding is controlled. Mildly tender to palpation. No obvious edema, ecchymosis, erythema, deformity noted. DP pulses are 2+ bilaterally in lower extremities.  Sensation intact in all dermatomes. Cap refill is normal. Strength out of 5 in lower extremities bilaterally.  Lymphadenopathy:    Pt has no cervical adenopathy.  Neurological: Pt is alert and oriented to person, place, and time. Normal reflexes. No cranial nerve deficit. GCS eye subscore is 4. GCS verbal subscore is 5. GCS motor subscore is 6.  Reflex Scores:      Bicep reflexes are 2+ on the right side and 2+ on the left side.      Brachioradialis reflexes are 2+ on the right side and 2+ on the left side.      Patellar reflexes are 2+ on the right side and 2+ on the left side.      Achilles reflexes are 2+ on the right side and 2+ on the left side. Speech is clear and goal oriented, follows commands Normal 5/5 strength in upper and lower extremities bilaterally including dorsiflexion and plantar  flexion, strong and equal grip strength Sensation normal to light and sharp touch Moves extremities without ataxia, coordination intact Normal gait and balance No Clonus  Skin: Skin is warm and dry. No rash noted. Pt is not diaphoretic. No erythema.  Psychiatric: Normal mood and affect.  Nursing note and vitals reviewed.     ED Treatments / Results  Labs (all labs ordered are listed, but only abnormal results are displayed) Labs Reviewed - No data to display  EKG  EKG Interpretation None       Radiology Dg Knee Complete 4 Views Left  Result Date: 02/13/2017 CLINICAL DATA:  Fall, left knee abrasions, pain, initial encounter. EXAM: LEFT KNEE - COMPLETE 4+ VIEW COMPARISON:  None. FINDINGS: No joint effusion or fracture. Mild medial compartment osteophytosis. IMPRESSION: 1. No fracture or joint effusion. 2. Mild medial compartment osteophytosis. Electronically Signed   By: Leanna Battles M.D.   On: 02/13/2017 09:39   Dg Foot Complete Left  Result Date: 02/13/2017 CLINICAL DATA:  Fall, lacerations along the top of the foot, initial encounter. EXAM: LEFT FOOT - COMPLETE 3+ VIEW  COMPARISON:  None. FINDINGS: Hallux valgus and mild first metatarsophalangeal joint osteoarthritis. No acute osseous abnormality. Calcaneal spurs. No radiopaque foreign body. IMPRESSION: 1. No acute findings. 2. Mild first metatarsophalangeal joint osteoarthritis. Electronically Signed   By: Leanna Battles M.D.   On: 02/13/2017 10:59   Dg Hip Unilat With Pelvis 2-3 Views Left  Result Date: 02/13/2017 CLINICAL DATA:  Fall, pain and abrasions.  Initial encounter. EXAM: DG HIP (WITH OR WITHOUT PELVIS) 2-3V LEFT COMPARISON:  None. FINDINGS: No fracture or dislocation.  No degenerative changes. IMPRESSION: Negative. Electronically Signed   By: Leanna Battles M.D.   On: 02/13/2017 09:41    Procedures Procedures (including critical care time)  Medications Ordered in ED Medications  bacitracin ointment (not administered)  Tdap (BOOSTRIX) injection 0.5 mL (not administered)  naproxen (NAPROSYN) tablet 375 mg (not administered)     Initial Impression / Assessment and Plan / ED Course  I have reviewed the triage vital signs and the nursing notes.  Pertinent labs & imaging results that were available during my care of the patient were reviewed by me and considered in my medical decision making (see chart for details).     Patient presents to the ED with complaints of left hip pain, left knee pain, left ankle pain following mechanical fall prior to arrival. Patient denies LOC or head injury. Patient is neurovascularly intact with normal strength in all extremity. Patient X-Ray negative for obvious fracture or dislocation. Pain managed in ED. Pt advised to follow up with orthopedics if symptoms persist for possibility of missed fracture diagnosis. Patient given brace while in ED, conservative therapy recommended and discussed. Patient will be dc home & is agreeable with above plan.   Final Clinical Impressions(s) / ED Diagnoses   Final diagnoses:  Fall, initial encounter  Left hip pain  Injury  of left knee, initial encounter  Left foot pain  Abrasion    New Prescriptions New Prescriptions   CYCLOBENZAPRINE (FLEXERIL) 10 MG TABLET    Take 1 tablet (10 mg total) by mouth 2 (two) times daily as needed for muscle spasms.   NAPROXEN (NAPROSYN) 375 MG TABLET    Take 1 tablet (375 mg total) by mouth 2 (two) times daily.     Rise Mu, PA-C 02/13/17 1147    Rolland Porter, MD 02/21/17 [phone]

## 2017-02-13 NOTE — Discharge Instructions (Signed)
All of your imaging has been normal. With a knee immobilizer for comfort. Weight-bear as tolerated with crutches as able.  Please rest, ice, compress and elevated the affected body part to help with swelling and pain.  Please take the Naproxen as prescribed for pain. Do not take any additional NSAIDs including Motrin, Aleve, Ibuprofen, Advil.  Please the the Flexeril for muscle relaxation. This medication will make you drowsy so avoid situation that could place you in danger.   A signs of infection of the left foot. Follow up with her primary care doctor if symptoms not improving for further referral to orthopedic if necessary.

## 2017-02-13 NOTE — ED Notes (Signed)
Bed: WTR8 Expected date:  Expected time:  Means of arrival:  Comments: 
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
## 2017-04-08 ENCOUNTER — Emergency Department (HOSPITAL_COMMUNITY): Payer: No Typology Code available for payment source

## 2017-04-08 ENCOUNTER — Encounter (HOSPITAL_COMMUNITY): Payer: Self-pay | Admitting: Emergency Medicine

## 2017-04-08 ENCOUNTER — Emergency Department (HOSPITAL_COMMUNITY)
Admission: EM | Admit: 2017-04-08 | Discharge: 2017-04-08 | Disposition: A | Discharge status: Home / Self Care | Payer: No Typology Code available for payment source | Attending: Emergency Medicine | Admitting: Emergency Medicine

## 2017-04-08 DIAGNOSIS — Y939 Activity, unspecified: Secondary | ICD-10-CM | POA: Insufficient documentation

## 2017-04-08 DIAGNOSIS — M549 Dorsalgia, unspecified: Secondary | ICD-10-CM

## 2017-04-08 DIAGNOSIS — Z79899 Other long term (current) drug therapy: Secondary | ICD-10-CM | POA: Insufficient documentation

## 2017-04-08 DIAGNOSIS — Y92410 Unspecified street and highway as the place of occurrence of the external cause: Secondary | ICD-10-CM | POA: Diagnosis not present

## 2017-04-08 DIAGNOSIS — M546 Pain in thoracic spine: Secondary | ICD-10-CM | POA: Diagnosis present

## 2017-04-08 DIAGNOSIS — Y999 Unspecified external cause status: Secondary | ICD-10-CM | POA: Diagnosis not present

## 2017-04-08 DIAGNOSIS — M542 Cervicalgia: Secondary | ICD-10-CM | POA: Insufficient documentation

## 2017-04-08 MED ORDER — IBUPROFEN 800 MG PO TABS
800.0000 mg | ORAL_TABLET | Freq: Once | ORAL | Status: AC
  Administered 2017-04-08: 800 mg via ORAL
  Filled 2017-04-08: qty 1

## 2017-04-08 MED ORDER — CYCLOBENZAPRINE HCL 10 MG PO TABS: 10.0000 mg | ORAL_TABLET | Freq: Two times a day (BID) | ORAL | 0 refills | Status: DC | PRN
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
## 2017-04-15 ENCOUNTER — Encounter: Payer: Managed Care, Other (non HMO) | Admitting: Internal Medicine

## 2017-04-16 ENCOUNTER — Ambulatory Visit (INDEPENDENT_AMBULATORY_CARE_PROVIDER_SITE_OTHER): Payer: Managed Care, Other (non HMO) | Admitting: Internal Medicine

## 2017-04-16 ENCOUNTER — Other Ambulatory Visit (INDEPENDENT_AMBULATORY_CARE_PROVIDER_SITE_OTHER): Payer: Managed Care, Other (non HMO)

## 2017-04-16 ENCOUNTER — Encounter: Payer: Self-pay | Admitting: Internal Medicine

## 2017-04-16 ENCOUNTER — Encounter: Payer: Managed Care, Other (non HMO) | Admitting: Internal Medicine

## 2017-04-16 VITALS — BP 138/80 | HR 60 | Temp 97.7°F | Ht 62.0 in | Wt 167.0 lb

## 2017-04-16 DIAGNOSIS — Z0000 Encounter for general adult medical examination without abnormal findings: Secondary | ICD-10-CM | POA: Diagnosis not present

## 2017-04-16 DIAGNOSIS — Z23 Encounter for immunization: Secondary | ICD-10-CM | POA: Diagnosis not present

## 2017-04-16 LAB — CBC
HEMATOCRIT: 44.1 % (ref 36.0–46.0)
HEMOGLOBIN: 14.2 g/dL (ref 12.0–15.0)
MCHC: 32.3 g/dL (ref 30.0–36.0)
MCV: 94.2 fl (ref 78.0–100.0)
Platelets: 150 10*3/uL (ref 150.0–400.0)
RBC: 4.69 Mil/uL (ref 3.87–5.11)
RDW: 14.4 % (ref 11.5–15.5)
WBC: 3.8 10*3/uL — AB (ref 4.0–10.5)

## 2017-04-16 LAB — TSH: TSH: 1.39 u[IU]/mL (ref 0.35–4.50)

## 2017-04-16 LAB — COMPREHENSIVE METABOLIC PANEL
ALT: 14 U/L (ref 0–35)
AST: 22 U/L (ref 0–37)
Albumin: 4.3 g/dL (ref 3.5–5.2)
Alkaline Phosphatase: 77 U/L (ref 39–117)
BUN: 15 mg/dL (ref 6–23)
CO2: 30 meq/L (ref 19–32)
Calcium: 9.9 mg/dL (ref 8.4–10.5)
Chloride: 101 mEq/L (ref 96–112)
Creatinine, Ser: 0.99 mg/dL (ref 0.40–1.20)
GFR: 74.75 mL/min (ref >60.00)
GLUCOSE: 91 mg/dL (ref 70–99)
POTASSIUM: 3.7 meq/L (ref 3.5–5.1)
SODIUM: 140 meq/L (ref 135–145)
Total Bilirubin: 0.6 mg/dL (ref 0.2–1.2)
Total Protein: 7.6 g/dL (ref 6.0–8.3)

## 2017-04-16 LAB — VITAMIN D 25 HYDROXY (VIT D DEFICIENCY, FRACTURES): VITD: 59.88 ng/mL (ref 30.00–100.00)

## 2017-04-16 LAB — LIPID PANEL
CHOLESTEROL: 213 mg/dL — AB (ref 0–200)
HDL: 80.9 mg/dL (ref >39.00)
LDL CALC: 107 mg/dL — AB (ref 0–99)
NONHDL: 132.09
Total CHOL/HDL Ratio: 3
Triglycerides: 126 mg/dL (ref 0.0–149.0)
VLDL: 25.2 mg/dL (ref 0.0–40.0)

## 2017-04-16 NOTE — Assessment & Plan Note (Signed)
Flu shot given at visit, tetanus up to date. Mammogram and colonoscopy up to date. Counseled about shingrix. Counseled about sun safety and mole surveillance. Given screening recommendations.

## 2017-04-16 NOTE — Patient Instructions (Signed)
We are checking the labs today and will call you back with the results.   Health Maintenance, Female Adopting a healthy lifestyle and getting preventive care can go a long way to promote health and wellness. Talk with your health care provider about what schedule of regular examinations is right for you. This is a good chance for you to check in with your provider about disease prevention and staying healthy. In between checkups, there are plenty of things you can do on your own. Experts have done a lot of research about which lifestyle changes and preventive measures are most likely to keep you healthy. Ask your health care provider for more information. Weight and diet Eat a healthy diet  Be sure to include plenty of vegetables, fruits, low-fat dairy products, and lean protein.  Do not eat a lot of foods high in solid fats, added sugars, or salt.  Get regular exercise. This is one of the most important things you can do for your health. ? Most adults should exercise for at least 150 minutes each week. The exercise should increase your heart rate and make you sweat (moderate-intensity exercise). ? Most adults should also do strengthening exercises at least twice a week. This is in addition to the moderate-intensity exercise.  Maintain a healthy weight  Body mass index (BMI) is a measurement that can be used to identify possible weight problems. It estimates body fat based on height and weight. Your health care provider can help determine your BMI and help you achieve or maintain a healthy weight.  For females 30 years of age and older: ? A BMI below 18.5 is considered underweight. ? A BMI of 18.5 to 24.9 is normal. ? A BMI of 25 to 29.9 is considered overweight. ? A BMI of 30 and above is considered obese.  Watch levels of cholesterol and blood lipids  You should start having your blood tested for lipids and cholesterol at 55 years of age, then have this test every 5 years.  You may  need to have your cholesterol levels checked more often if: ? Your lipid or cholesterol levels are high. ? You are older than 55 years of age. ? You are at high risk for heart disease.  Cancer screening Lung Cancer  Lung cancer screening is recommended for adults 34-14 years old who are at high risk for lung cancer because of a history of smoking.  A yearly low-dose CT scan of the lungs is recommended for people who: ? Currently smoke. ? Have quit within the past 15 years. ? Have at least a 30-pack-year history of smoking. A pack year is smoking an average of one pack of cigarettes a day for 1 year.  Yearly screening should continue until it has been 15 years since you quit.  Yearly screening should stop if you develop a health problem that would prevent you from having lung cancer treatment.  Breast Cancer  Practice breast self-awareness. This means understanding how your breasts normally appear and feel.  It also means doing regular breast self-exams. Let your health care provider know about any changes, no matter how small.  If you are in your 20s or 30s, you should have a clinical breast exam (CBE) by a health care provider every 1-3 years as part of a regular health exam.  If you are 76 or older, have a CBE every year. Also consider having a breast X-ray (mammogram) every year.  If you have a family history of breast cancer,  talk to your health care provider about genetic screening.  If you are at high risk for breast cancer, talk to your health care provider about having an MRI and a mammogram every year.  Breast cancer gene (BRCA) assessment is recommended for women who have family members with BRCA-related cancers. BRCA-related cancers include: ? Breast. ? Ovarian. ? Tubal. ? Peritoneal cancers.  Results of the assessment will determine the need for genetic counseling and BRCA1 and BRCA2 testing.  Cervical Cancer Your health care provider may recommend that you be  screened regularly for cancer of the pelvic organs (ovaries, uterus, and vagina). This screening involves a pelvic examination, including checking for microscopic changes to the surface of your cervix (Pap test). You may be encouraged to have this screening done every 3 years, beginning at age 21.  For women ages 29-65, health care providers may recommend pelvic exams and Pap testing every 3 years, or they may recommend the Pap and pelvic exam, combined with testing for human papilloma virus (HPV), every 5 years. Some types of HPV increase your risk of cervical cancer. Testing for HPV may also be done on women of any age with unclear Pap test results.  Other health care providers may not recommend any screening for nonpregnant women who are considered low risk for pelvic cancer and who do not have symptoms. Ask your health care provider if a screening pelvic exam is right for you.  If you have had past treatment for cervical cancer or a condition that could lead to cancer, you need Pap tests and screening for cancer for at least 20 years after your treatment. If Pap tests have been discontinued, your risk factors (such as having a new sexual partner) need to be reassessed to determine if screening should resume. Some women have medical problems that increase the chance of getting cervical cancer. In these cases, your health care provider may recommend more frequent screening and Pap tests.  Colorectal Cancer  This type of cancer can be detected and often prevented.  Routine colorectal cancer screening usually begins at 55 years of age and continues through 55 years of age.  Your health care provider may recommend screening at an earlier age if you have risk factors for colon cancer.  Your health care provider may also recommend using home test kits to check for hidden blood in the stool.  A small camera at the end of a tube can be used to examine your colon directly (sigmoidoscopy or colonoscopy).  This is done to check for the earliest forms of colorectal cancer.  Routine screening usually begins at age 92.  Direct examination of the colon should be repeated every 5-10 years through 55 years of age. However, you may need to be screened more often if early forms of precancerous polyps or small growths are found.  Skin Cancer  Check your skin from head to toe regularly.  Tell your health care provider about any new moles or changes in moles, especially if there is a change in a mole's shape or color.  Also tell your health care provider if you have a mole that is larger than the size of a pencil eraser.  Always use sunscreen. Apply sunscreen liberally and repeatedly throughout the day.  Protect yourself by wearing long sleeves, pants, a wide-brimmed hat, and sunglasses whenever you are outside.  Heart disease, diabetes, and high blood pressure  High blood pressure causes heart disease and increases the risk of stroke. High blood pressure is  more likely to develop in: ? People who have blood pressure in the high end of the normal range (130-139/85-89 mm Hg). ? People who are overweight or obese. ? People who are African American.  If you are 47-80 years of age, have your blood pressure checked every 3-5 years. If you are 94 years of age or older, have your blood pressure checked every year. You should have your blood pressure measured twice-once when you are at a hospital or clinic, and once when you are not at a hospital or clinic. Record the average of the two measurements. To check your blood pressure when you are not at a hospital or clinic, you can use: ? An automated blood pressure machine at a pharmacy. ? A home blood pressure monitor.  If you are between 34 years and 10 years old, ask your health care provider if you should take aspirin to prevent strokes.  Have regular diabetes screenings. This involves taking a blood sample to check your fasting blood sugar level. ? If  you are at a normal weight and have a low risk for diabetes, have this test once every three years after 55 years of age. ? If you are overweight and have a high risk for diabetes, consider being tested at a younger age or more often. Preventing infection Hepatitis B  If you have a higher risk for hepatitis B, you should be screened for this virus. You are considered at high risk for hepatitis B if: ? You were born in a country where hepatitis B is common. Ask your health care provider which countries are considered high risk. ? Your parents were born in a high-risk country, and you have not been immunized against hepatitis B (hepatitis B vaccine). ? You have HIV or AIDS. ? You use needles to inject street drugs. ? You live with someone who has hepatitis B. ? You have had sex with someone who has hepatitis B. ? You get hemodialysis treatment. ? You take certain medicines for conditions, including cancer, organ transplantation, and autoimmune conditions.  Hepatitis C  Blood testing is recommended for: ? Everyone born from [birth year] through 1964. ? Anyone with known risk factors for hepatitis C.  Sexually transmitted infections (STIs)  You should be screened for sexually transmitted infections (STIs) including gonorrhea and chlamydia if: ? You are sexually active and are younger than 55 years of age. ? You are older than 55 years of age and your health care provider tells you that you are at risk for this type of infection. ? Your sexual activity has changed since you were last screened and you are at an increased risk for chlamydia or gonorrhea. Ask your health care provider if you are at risk.  If you do not have HIV, but are at risk, it may be recommended that you take a prescription medicine daily to prevent HIV infection. This is called pre-exposure prophylaxis (PrEP). You are considered at risk if: ? You are sexually active and do not regularly use condoms or know the HIV status of your  partner(s). ? You take drugs by injection. ? You are sexually active with a partner who has HIV.  Talk with your health care provider about whether you are at high risk of being infected with HIV. If you choose to begin PrEP, you should first be tested for HIV. You should then be tested every 3 months for as long as you are taking PrEP. Pregnancy  If you are premenopausal and you  may become pregnant, ask your health care provider about preconception counseling.  If you may become pregnant, take 400 to 800 micrograms (mcg) of folic acid every day.  If you want to prevent pregnancy, talk to your health care provider about birth control (contraception). Osteoporosis and menopause  Osteoporosis is a disease in which the bones lose minerals and strength with aging. This can result in serious bone fractures. Your risk for osteoporosis can be identified using a bone density scan.  If you are 59 years of age or older, or if you are at risk for osteoporosis and fractures, ask your health care provider if you should be screened.  Ask your health care provider whether you should take a calcium or vitamin D supplement to lower your risk for osteoporosis.  Menopause may have certain physical symptoms and risks.  Hormone replacement therapy may reduce some of these symptoms and risks. Talk to your health care provider about whether hormone replacement therapy is right for you. Follow these instructions at home:  Schedule regular health, dental, and eye exams.  Stay current with your immunizations.  Do not use any tobacco products including cigarettes, chewing tobacco, or electronic cigarettes.  If you are pregnant, do not drink alcohol.  If you are breastfeeding, limit how much and how often you drink alcohol.  Limit alcohol intake to no more than 1 drink per day for nonpregnant women. One drink equals 12 ounces of beer, 5 ounces of wine, or 1 ounces of hard liquor.  Do not use street  drugs.  Do not share needles.  Ask your health care provider for help if you need support or information about quitting drugs.  Tell your health care provider if you often feel depressed.  Tell your health care provider if you have ever been abused or do not feel safe at home. This information is not intended to replace advice given to you by your health care provider. Make sure you discuss any questions you have with your health care provider. Document Released: 12/29/2010 Document Revised: 11/21/2015 Document Reviewed: 03/19/2015 Elsevier Interactive Patient Education  [employer].

## 2017-04-16 NOTE — Progress Notes (Signed)
   Subjective:    Patient ID: Kayla Barnett, female    DOB: 02/15/1961, 56 y.o.   MRN: [id number]  HPI The patient is a 56 YO female coming in for physical.   [facility] reviewed and updated.   Review of Systems  Constitutional: Negative.   HENT: Negative.   Eyes: Negative.   Respiratory: Negative for cough, chest tightness and shortness of breath.   Cardiovascular: Negative for chest pain, palpitations and leg swelling.  Gastrointestinal: Negative for abdominal distention, abdominal pain, constipation, diarrhea, nausea and vomiting.  Musculoskeletal: Negative.   Skin: Negative.   Neurological: Negative.   Psychiatric/Behavioral: Negative.       Objective:   Physical Exam  Constitutional: She is oriented to person, place, and time. She appears well-developed and well-nourished.  HENT:  Head: Normocephalic and atraumatic.  Eyes: EOM are normal.  Neck: Normal range of motion.  Cardiovascular: Normal rate and regular rhythm.   Pulmonary/Chest: Effort normal and breath sounds normal. No respiratory distress. She has no wheezes. She has no rales.  Abdominal: Soft. Bowel sounds are normal. She exhibits no distension. There is no tenderness. There is no rebound.  Musculoskeletal: She exhibits no edema.  Neurological: She is alert and oriented to person, place, and time. Coordination normal.  Skin: Skin is warm and dry.  Psychiatric: She has a normal mood and affect.   Vitals:   04/16/17 1505  BP: 138/80  Pulse: 60  Temp: 97.7 F (36.5 C)  TempSrc: Oral  SpO2: 100%  Weight: 167 lb (75.8 kg)  Height: 5\' 2"  (1.575 m)      Assessment & Plan:  Flu shot given at visit

## 2017-04-19 ENCOUNTER — Telehealth: Payer: Self-pay | Admitting: *Deleted

## 2017-04-19 NOTE — Telephone Encounter (Signed)
Left patient a message to call the office.

## 2017-04-19 NOTE — Telephone Encounter (Signed)
-----   Message from Myrlene BrokerElizabeth A Crawford, MD sent at 04/19/2017  7:43 AM EDT ----- Please call and let her know that the labs are normal including cholesterol, kidneys, liver, thyroid, vitamin D.

## 2017-04-20 NOTE — Telephone Encounter (Signed)
Pt given lab results and expressed understanding. Pt would like her lab results mailed to her.

## 2017-04-22 NOTE — Telephone Encounter (Signed)
Results mailed 

## 2017-04-30 ENCOUNTER — Telehealth: Payer: Self-pay | Admitting: *Deleted

## 2017-04-30 NOTE — Telephone Encounter (Signed)
Rec'd call pt requesting refill on her Phentermine-Topiramate....Raechel Chute/lmb

## 2017-05-03 NOTE — Telephone Encounter (Signed)
Called and unable to leave a message.

## 2017-05-03 NOTE — Telephone Encounter (Signed)
Pls call pt inform of MD response below. Need appt for refill...Kayla Barnett/lmb

## 2017-05-03 NOTE — Telephone Encounter (Signed)
Needs follow up visit

## 2017-05-04 NOTE — Telephone Encounter (Signed)
LVM to call back and sch appointment for refill

## 2017-06-21 LAB — HM MAMMOGRAPHY

## 2017-06-24 LAB — HM PAP SMEAR

## 2017-10-25 ENCOUNTER — Encounter: Payer: Self-pay | Admitting: Internal Medicine

## 2017-10-25 ENCOUNTER — Ambulatory Visit (INDEPENDENT_AMBULATORY_CARE_PROVIDER_SITE_OTHER): Payer: Managed Care, Other (non HMO) | Admitting: Internal Medicine

## 2017-10-25 VITALS — BP 116/70 | HR 59 | Temp 97.8°F | Ht 62.0 in | Wt 176.0 lb

## 2017-10-25 DIAGNOSIS — E669 Obesity, unspecified: Secondary | ICD-10-CM | POA: Diagnosis not present

## 2017-10-25 DIAGNOSIS — Z6830 Body mass index (BMI) 30.0-30.9, adult: Secondary | ICD-10-CM

## 2017-10-25 DIAGNOSIS — M79671 Pain in right foot: Secondary | ICD-10-CM

## 2017-10-25 DIAGNOSIS — M25521 Pain in right elbow: Secondary | ICD-10-CM | POA: Diagnosis not present

## 2017-10-25 MED ORDER — PHENTERMINE-TOPIRAMATE ER 7.5-46 MG PO CP24: 1.0000 | ORAL_CAPSULE | Freq: Every day | ORAL | 1 refills | Status: DC

## 2017-10-25 MED ORDER — MELOXICAM 15 MG PO TABS: 15.0000 mg | ORAL_TABLET | Freq: Every day | ORAL | 0 refills | Status: DC

## 2017-10-25 NOTE — Progress Notes (Signed)
   Subjective:    Patient ID: Kayla Barnett, female    DOB: 01/05/1961, 57 y.o.   MRN: [id number]  HPI The patient is a 57 YO female coming in for pain in the right elbow and the right foot. Started about 2-4 weeks ago now. Has not tried anything for pain. Denies injury or overuse. She is having the pain in her right foot which is worse in the morning and hard to stand on. She then is able to stretch it out and the pain goes down some. She has not taken anything for it. Feels like her work shoes are some responsible. She denies injury or overuse. The right elbow seems to hurt when she does certain things. When she sleeps on it at night is the worst. She denies injury or fall. Denies numbness or weakness in the arm or hand. She denies pain with movement of her hand or wrist. Overall stable since onset.  She also needs follow up of her weight. She is not happy with it currently. Previously was taking qsymia and this seemed to help some. She denies taking it currently as she ran out and needed visit for refill. She has been eating more off it. It did help with cravings some.   Review of Systems  Constitutional: Positive for appetite change and unexpected weight change. Negative for activity change, diaphoresis, fatigue and fever.  HENT: Negative.   Eyes: Negative.   Respiratory: Negative for cough, chest tightness and shortness of breath.   Cardiovascular: Negative for chest pain, palpitations and leg swelling.  Gastrointestinal: Negative for abdominal distention, abdominal pain, constipation, diarrhea, nausea and vomiting.  Musculoskeletal: Positive for arthralgias and myalgias.  Skin: Negative.   Neurological: Negative.   Psychiatric/Behavioral: Negative.       Objective:   Physical Exam  Constitutional: She is oriented to person, place, and time. She appears well-developed and well-nourished.  HENT:  Head: Normocephalic and atraumatic.  Eyes: EOM are normal.  Neck: Normal range of  motion.  Cardiovascular: Normal rate and regular rhythm.  Pulmonary/Chest: Effort normal and breath sounds normal. No respiratory distress. She has no wheezes. She has no rales.  Abdominal: Soft.  Musculoskeletal: She exhibits tenderness. She exhibits no edema.  Pain along the radial aspect of the right elbow along the tendon insertion. No radiation with palpation. Full ROM and no numbness. Pain on the plantar surface midline right foot.   Neurological: She is alert and oriented to person, place, and time. Coordination normal.  Skin: Skin is warm and dry.  Psychiatric: She has a normal mood and affect.   Vitals:   10/25/17 1609  BP: 116/70  Pulse: (!) 59  Temp: 97.8 F (36.6 C)  TempSrc: Oral  SpO2: 99%  Weight: 176 lb (79.8 kg)  Height:  (1.575 m)      Assessment & Plan:

## 2017-10-25 NOTE — Patient Instructions (Addendum)
We have sent in the anti-inflammatory medicine for the pain called mobic that you take once daily.  We have sent in the refill of the weight medicine so come back in 3-4 months to check in on the weight.     Elbow Rehab Ask your health care provider which exercises are safe for you. Do exercises exactly as told by your health care provider and adjust them as directed. It is normal to feel mild stretching, pulling, tightness, or discomfort as you do these exercises, but you should stop right away if you feel sudden pain or your pain gets worse. Do not begin these exercises until told by your health care provider. Stretching and range of motion exercises These exercises warm up your muscles and joints and improve the movement and flexibility of your elbow. Exercise A: Wrist flexors  1. Straighten your left / right elbow in front of you with your palm up. If told by your health care provider, do this stretch with your elbow bent rather than straight. 2. With your other hand, gently pull your left / right hand and fingers toward you until you feel a gentle stretch on the top of your forearm. 3. Hold this position for __________ seconds. Repeat __________ times. Complete this exercise __________ times a day. Strengthening exercises These exercises build strength and endurance in your elbow. Endurance is the ability to use your muscles for a long time, even after several repetitions. Exercise B: Wrist flexion  1. Sit with your left / right forearm palm-up and supported on a table or other surface. 2. Let your left / right wrist extend over the edge of the surface. 3. Hold a __________ weight or a piece of rubber exercise band or tubing. Slowly curl your hand up toward your forearm. Try to only move your hand and keep the rest of your arm still. 4. Hold this position for __________ seconds. 5. Slowly return to the starting position. Repeat __________ times. Complete this exercise __________ times a  day. Exercise C: Eccentric wrist flexion 1. Sit with your left / right forearm palm-up and supported on a table or other surface. 2. Let your left / right wrist extend over the edge of the surface. 3. Hold a __________ weight or a piece of rubber exercise band or tubing. 4. Use your other hand to move your left / right hand up toward your forearm. 5. Slowly return to the starting position using only your left / right hand. Repeat __________ times. Complete this exercise __________ times a day. Exercise D: Hand turns, pronation i 1. Sit with your left / right forearm supported on a table or other surface. 2. Move your wrist so your pinkie travels toward your forearm and your thumb moves away from your forearm. 3. Hold this position for __________ seconds. 4. Slowly return to the starting position. Exercise E: Hand turns, pronation ii  1. Sit with your left / right forearm supported on a table or other surface. 2. Hold a hammer in your left / right hand. ? The exercise will be easier if you hold on near the head of the hammer. ? If you hold on toward the end of the handle, the exercise will be harder. 3. Rest your left / right hand over the edge of the table with your palm facing up. 4. Without moving your elbow, slowly turn your palm and your hand down toward the table. 5. Hold this position for __________ seconds. 6. Slowly return to the starting  position. Repeat __________ times. Complete this exercise __________ times a day. Exercise F: Shoulder blade squeeze 1. Sit in a stable chair with good posture. Do not let your back touch the back of the chair. 2. Your arms should be at your sides with your elbows bent. You can rest your forearms on a pillow. 3. Squeeze your shoulder blades together. Keep your shoulders level. Do not lift your shoulders up toward your ears. 4. Hold this position for __________ seconds. 5. Slowly release. Repeat __________ times. Complete this exercise  __________ times a day. This information is not intended to replace advice given to you by your health care provider. Make sure you discuss any questions you have with your health care provider. Document Released: 06/15/2005 Document Revised: 02/20/2016 Document Reviewed: 02/25/2015 Elsevier Interactive Patient Education  2018 [employer]   Plantar Fasciitis Rehab Ask your health care provider which exercises are safe for you. Do exercises exactly as told by your health care provider and adjust them as directed. It is normal to feel mild stretching, pulling, tightness, or discomfort as you do these exercises, but you should stop right away if you feel sudden pain or your pain gets worse. Do not begin these exercises until told by your health care provider. Stretching and range of motion exercises These exercises warm up your muscles and joints and improve the movement and flexibility of your foot. These exercises also help to relieve pain. Exercise A: Plantar fascia stretch  1. Sit with your left / right leg crossed over your opposite knee. 2. Hold your heel with one hand with that thumb near your arch. With your other hand, hold your toes and gently pull them back toward the top of your foot. You should feel a stretch on the bottom of your toes or your foot or both. 3. Hold this stretch for__________ seconds. 4. Slowly release your toes and return to the starting position. Repeat __________ times. Complete this exercise __________ times a day. Exercise B: Gastroc, standing  1. Stand with your hands against a wall. 2. Extend your left / right leg behind you, and bend your front knee slightly. 3. Keeping your heels on the floor and keeping your back knee straight, shift your weight toward the wall without arching your back. You should feel a gentle stretch in your left / right calf. 4. Hold this position for __________ seconds. Repeat __________ times. Complete this exercise __________ times  a day. Exercise C: Soleus, standing 1. Stand with your hands against a wall. 2. Extend your left / right leg behind you, and bend your front knee slightly. 3. Keeping your heels on the floor, bend your back knee and slightly shift your weight over the back leg. You should feel a gentle stretch deep in your calf. 4. Hold this position for __________ seconds. Repeat __________ times. Complete this exercise __________ times a day. Exercise D: Gastrocsoleus, standing 1. Stand with the ball of your left / right foot on a step. The ball of your foot is on the walking surface, right under your toes. 2. Keep your other foot firmly on the same step. 3. Hold onto the wall or a railing for balance. 4. Slowly lift your other foot, allowing your body weight to press your heel down over the edge of the step. You should feel a stretch in your left / right calf. 5. Hold this position for __________ seconds. 6. Return both feet to the step. 7. Repeat this exercise with a slight  bend in your left / right knee. Repeat __________ times with your left / right knee straight and __________ times with your left / right knee bent. Complete this exercise __________ times a day. Balance exercise This exercise builds your balance and strength control of your arch to help take pressure off your plantar fascia. Exercise E: Single leg stand 1. Without shoes, stand near a railing or in a doorway. You may hold onto the railing or door frame as needed. 2. Stand on your left / right foot. Keep your big toe down on the floor and try to keep your arch lifted. Do not let your foot roll inward. 3. Hold this position for __________ seconds. 4. If this exercise is too easy, you can try it with your eyes closed or while standing on a pillow. Repeat __________ times. Complete this exercise __________ times a day. This information is not intended to replace advice given to you by your health care provider. Make sure you discuss any  questions you have with your health care provider. Document Released: 06/15/2005 Document Revised: 02/18/2016 Document Reviewed: 04/29/2015 Elsevier Interactive Patient Education  2018 [employer].

## 2017-10-26 DIAGNOSIS — M79671 Pain in right foot: Secondary | ICD-10-CM | POA: Insufficient documentation

## 2017-10-26 DIAGNOSIS — M25521 Pain in right elbow: Secondary | ICD-10-CM | POA: Insufficient documentation

## 2017-10-26 DIAGNOSIS — E669 Obesity, unspecified: Secondary | ICD-10-CM | POA: Insufficient documentation

## 2017-10-26 NOTE — Assessment & Plan Note (Signed)
Rx for qsymia today which has helped her in the past. She wants to continue and will return in 2-3 months to see how it is working with weight loss.

## 2017-10-26 NOTE — Assessment & Plan Note (Signed)
Suspect mild plantar fasciitis and given stretching exercises. Rx for mobic for pain if needed.

## 2017-10-26 NOTE — Assessment & Plan Note (Signed)
Suspect some tendonitis. Rx for mobic to take daily for 2 weeks or so. Given stretching exercises. Recommended sports medicine follow up if no improvement or if worsening.

## 2018-04-19 ENCOUNTER — Encounter: Payer: Self-pay | Admitting: Internal Medicine

## 2018-04-19 DIAGNOSIS — Z0289 Encounter for other administrative examinations: Secondary | ICD-10-CM

## 2018-04-26 ENCOUNTER — Encounter: Payer: Managed Care, Other (non HMO) | Admitting: Internal Medicine

## 2018-05-06 ENCOUNTER — Encounter: Payer: Self-pay | Admitting: Internal Medicine

## 2018-05-06 ENCOUNTER — Ambulatory Visit (INDEPENDENT_AMBULATORY_CARE_PROVIDER_SITE_OTHER): Payer: Managed Care, Other (non HMO) | Admitting: Internal Medicine

## 2018-05-06 ENCOUNTER — Other Ambulatory Visit (INDEPENDENT_AMBULATORY_CARE_PROVIDER_SITE_OTHER): Payer: Managed Care, Other (non HMO)

## 2018-05-06 VITALS — BP 150/90 | HR 54 | Temp 97.9°F | Ht 62.0 in | Wt 180.0 lb

## 2018-05-06 DIAGNOSIS — R030 Elevated blood-pressure reading, without diagnosis of hypertension: Secondary | ICD-10-CM | POA: Diagnosis not present

## 2018-05-06 DIAGNOSIS — Z0000 Encounter for general adult medical examination without abnormal findings: Secondary | ICD-10-CM

## 2018-05-06 DIAGNOSIS — E669 Obesity, unspecified: Secondary | ICD-10-CM | POA: Diagnosis not present

## 2018-05-06 DIAGNOSIS — Z23 Encounter for immunization: Secondary | ICD-10-CM | POA: Diagnosis not present

## 2018-05-06 LAB — LIPID PANEL
CHOLESTEROL: 228 mg/dL — AB (ref 0–200)
HDL: 82.5 mg/dL (ref >39.00)
LDL Cholesterol: 116 mg/dL — ABNORMAL HIGH (ref 0–99)
NonHDL: 145.89
Total CHOL/HDL Ratio: 3
Triglycerides: 147 mg/dL (ref 0.0–149.0)
VLDL: 29.4 mg/dL (ref 0.0–40.0)

## 2018-05-06 LAB — COMPREHENSIVE METABOLIC PANEL
ALBUMIN: 4.4 g/dL (ref 3.5–5.2)
ALK PHOS: 68 U/L (ref 39–117)
ALT: 16 U/L (ref 0–35)
AST: 21 U/L (ref 0–37)
BUN: 13 mg/dL (ref 6–23)
CHLORIDE: 103 meq/L (ref 96–112)
CO2: 31 mEq/L (ref 19–32)
CREATININE: 0.99 mg/dL (ref 0.40–1.20)
Calcium: 9.6 mg/dL (ref 8.4–10.5)
GFR: 74.46 mL/min (ref >60.00)
GLUCOSE: 84 mg/dL (ref 70–99)
Potassium: 3.6 mEq/L (ref 3.5–5.1)
Sodium: 141 mEq/L (ref 135–145)
TOTAL PROTEIN: 7.7 g/dL (ref 6.0–8.3)
Total Bilirubin: 0.7 mg/dL (ref 0.2–1.2)

## 2018-05-06 LAB — CBC
HEMATOCRIT: 41.4 % (ref 36.0–46.0)
Hemoglobin: 13.6 g/dL (ref 12.0–15.0)
MCHC: 32.8 g/dL (ref 30.0–36.0)
MCV: 93.1 fl (ref 78.0–100.0)
Platelets: 166 10*3/uL (ref 150.0–400.0)
RBC: 4.45 Mil/uL (ref 3.87–5.11)
RDW: 14.2 % (ref 11.5–15.5)
WBC: 4.1 10*3/uL (ref 4.0–10.5)

## 2018-05-06 MED ORDER — TOPIRAMATE 50 MG PO TABS
50.0000 mg | ORAL_TABLET | Freq: Every day | ORAL | 3 refills | Status: DC
Start: 2018-05-06 — End: 2019-03-21

## 2018-05-06 MED ORDER — PHENTERMINE-TOPIRAMATE ER 15-92 MG PO CP24: 1.0000 | ORAL_CAPSULE | Freq: Every day | ORAL | 1 refills | Status: DC

## 2018-05-06 NOTE — Assessment & Plan Note (Signed)
Unclear etiology and will monitor as she has not had previously and recheck is 150/90.

## 2018-05-06 NOTE — Assessment & Plan Note (Signed)
Flu shot declines. Shingrix 1st given today. Tetanus up to date. Colonoscopy up to date. Mammogram up to date with gyn, pap smear up to date with gyn. Counseled about sun safety and mole surveillance. Counseled about the dangers of distracted driving. Given 10 year screening recommendations.

## 2018-05-06 NOTE — Progress Notes (Signed)
   Subjective:    Patient ID: Kayla Barnett, female    DOB: 12-Apr-1961, 57 y.o.   MRN: [id number]  HPI The patient is a 57 YO female coming in for physical.   PMH, [facility], social history reviewed and updated.   Review of Systems  Constitutional: Negative.   HENT: Negative.   Eyes: Negative.   Respiratory: Negative for cough, chest tightness and shortness of breath.   Cardiovascular: Negative for chest pain, palpitations and leg swelling.  Gastrointestinal: Negative for abdominal distention, abdominal pain, constipation, diarrhea, nausea and vomiting.  Musculoskeletal: Negative.   Skin: Negative.   Neurological: Negative.   Psychiatric/Behavioral: Negative.       Objective:   Physical Exam  Constitutional: She is oriented to person, place, and time. She appears well-developed and well-nourished.  HENT:  Head: Normocephalic and atraumatic.  Eyes: EOM are normal.  Neck: Normal range of motion.  Cardiovascular: Normal rate and regular rhythm.  Pulmonary/Chest: Effort normal and breath sounds normal. No respiratory distress. She has no wheezes. She has no rales.  Abdominal: Soft. Bowel sounds are normal. She exhibits no distension. There is no tenderness. There is no rebound.  Musculoskeletal: She exhibits no edema.  Neurological: She is alert and oriented to person, place, and time. Coordination normal.  Skin: Skin is warm and dry.  Psychiatric: She has a normal mood and affect.   Vitals:   05/06/18 1540  BP: (!) 160/100  Pulse: (!) 54  Temp: 97.9 F (36.6 C)  TempSrc: Oral  SpO2: 99%  Weight: 180 lb (81.6 kg)  Height: 5\' 2"  (1.575 m)      Assessment & Plan:  Shingrix given IM

## 2018-05-06 NOTE — Patient Instructions (Addendum)
We have given you the first shingles vaccine today and you can come back in 2 months for the second one.  We have sent in topamax to take in the evening to help with appetite and the higher dose of the weight medicine also.   Health Maintenance, Female Adopting a healthy lifestyle and getting preventive care can go a long way to promote health and wellness. Talk with your health care provider about what schedule of regular examinations is right for you. This is a good chance for you to check in with your provider about disease prevention and staying healthy. In between checkups, there are plenty of things you can do on your own. Experts have done a lot of research about which lifestyle changes and preventive measures are most likely to keep you healthy. Ask your health care provider for more information. Weight and diet Eat a healthy diet  Be sure to include plenty of vegetables, fruits, low-fat dairy products, and lean protein.  Do not eat a lot of foods high in solid fats, added sugars, or salt.  Get regular exercise. This is one of the most important things you can do for your health. ? Most adults should exercise for at least 150 minutes each week. The exercise should increase your heart rate and make you sweat (moderate-intensity exercise). ? Most adults should also do strengthening exercises at least twice a week. This is in addition to the moderate-intensity exercise.  Maintain a healthy weight  Body mass index (BMI) is a measurement that can be used to identify possible weight problems. It estimates body fat based on height and weight. Your health care provider can help determine your BMI and help you achieve or maintain a healthy weight.  For females 81 years of age and older: ? A BMI below 18.5 is considered underweight. ? A BMI of 18.5 to 24.9 is normal. ? A BMI of 25 to 29.9 is considered overweight. ? A BMI of 30 and above is considered obese.  Watch levels of cholesterol and  blood lipids  You should start having your blood tested for lipids and cholesterol at 56 years of age, then have this test every 5 years.  You may need to have your cholesterol levels checked more often if: ? Your lipid or cholesterol levels are high. ? You are older than 56 years of age. ? You are at high risk for heart disease.  Cancer screening Lung Cancer  Lung cancer screening is recommended for adults 41-84 years old who are at high risk for lung cancer because of a history of smoking.  A yearly low-dose CT scan of the lungs is recommended for people who: ? Currently smoke. ? Have quit within the past 15 years. ? Have at least a 30-pack-year history of smoking. A pack year is smoking an average of one pack of cigarettes a day for 1 year.  Yearly screening should continue until it has been 15 years since you quit.  Yearly screening should stop if you develop a health problem that would prevent you from having lung cancer treatment.  Breast Cancer  Practice breast self-awareness. This means understanding how your breasts normally appear and feel.  It also means doing regular breast self-exams. Let your health care provider know about any changes, no matter how small.  If you are in your 20s or 30s, you should have a clinical breast exam (CBE) by a health care provider every 1-3 years as part of a regular health exam.  exam.  If you are 40 or older, have a CBE every year. Also consider having a breast X-ray (mammogram) every year.  If you have a family history of breast cancer, talk to your health care provider about genetic screening.  If you are at high risk for breast cancer, talk to your health care provider about having an MRI and a mammogram every year.  Breast cancer gene (BRCA) assessment is recommended for women who have family members with BRCA-related cancers. BRCA-related cancers include: ? Breast. ? Ovarian. ? Tubal. ? Peritoneal cancers.  Results of the assessment  will determine the need for genetic counseling and BRCA1 and BRCA2 testing.  Cervical Cancer Your health care provider may recommend that you be screened regularly for cancer of the pelvic organs (ovaries, uterus, and vagina). This screening involves a pelvic examination, including checking for microscopic changes to the surface of your cervix (Pap test). You may be encouraged to have this screening done every 3 years, beginning at age 21.  For women ages 30-65, health care providers may recommend pelvic exams and Pap testing every 3 years, or they may recommend the Pap and pelvic exam, combined with testing for human papilloma virus (HPV), every 5 years. Some types of HPV increase your risk of cervical cancer. Testing for HPV may also be done on women of any age with unclear Pap test results.  Other health care providers may not recommend any screening for nonpregnant women who are considered low risk for pelvic cancer and who do not have symptoms. Ask your health care provider if a screening pelvic exam is right for you.  If you have had past treatment for cervical cancer or a condition that could lead to cancer, you need Pap tests and screening for cancer for at least 20 years after your treatment. If Pap tests have been discontinued, your risk factors (such as having a new sexual partner) need to be reassessed to determine if screening should resume. Some women have medical problems that increase the chance of getting cervical cancer. In these cases, your health care provider may recommend more frequent screening and Pap tests.  Colorectal Cancer  This type of cancer can be detected and often prevented.  Routine colorectal cancer screening usually begins at 56 years of age and continues through 56 years of age.  Your health care provider may recommend screening at an earlier age if you have risk factors for colon cancer.  Your health care provider may also recommend using home test kits to  check for hidden blood in the stool.  A small camera at the end of a tube can be used to examine your colon directly (sigmoidoscopy or colonoscopy). This is done to check for the earliest forms of colorectal cancer.  Routine screening usually begins at age 50.  Direct examination of the colon should be repeated every 5-10 years through 56 years of age. However, you may need to be screened more often if early forms of precancerous polyps or small growths are found.  Skin Cancer  Check your skin from head to toe regularly.  Tell your health care provider about any new moles or changes in moles, especially if there is a change in a mole's shape or color.  Also tell your health care provider if you have a mole that is larger than the size of a pencil eraser.  Always use sunscreen. Apply sunscreen liberally and repeatedly throughout the day.  Protect yourself by wearing long sleeves, pants, a wide-brimmed   sunglasses whenever you are outside.  Heart disease, diabetes, and high blood pressure  High blood pressure causes heart disease and increases the risk of stroke. High blood pressure is more likely to develop in: ? People who have blood pressure in the high end of the normal range (130-139/85-89 mm Hg). ? People who are overweight or obese. ? People who are African American.  If you are 67-53 years of age, have your blood pressure checked every 3-5 years. If you are 45 years of age or older, have your blood pressure checked every year. You should have your blood pressure measured twice-once when you are at a hospital or clinic, and once when you are not at a hospital or clinic. Record the average of the two measurements. To check your blood pressure when you are not at a hospital or clinic, you can use: ? An automated blood pressure machine at a pharmacy. ? A home blood pressure monitor.  If you are between 47 years and 65 years old, ask your health care provider if you should  take aspirin to prevent strokes.  Have regular diabetes screenings. This involves taking a blood sample to check your fasting blood sugar level. ? If you are at a normal weight and have a low risk for diabetes, have this test once every three years after 56 years of age. ? If you are overweight and have a high risk for diabetes, consider being tested at a younger age or more often. Preventing infection Hepatitis B  If you have a higher risk for hepatitis B, you should be screened for this virus. You are considered at high risk for hepatitis B if: ? You were born in a country where hepatitis B is common. Ask your health care provider which countries are considered high risk. ? Your parents were born in a high-risk country, and you have not been immunized against hepatitis B (hepatitis B vaccine). ? You have HIV or AIDS. ? You use needles to inject street drugs. ? You live with someone who has hepatitis B. ? You have had sex with someone who has hepatitis B. ? You get hemodialysis treatment. ? You take certain medicines for conditions, including cancer, organ transplantation, and autoimmune conditions.  Hepatitis C  Blood testing is recommended for: ? Everyone born from [birth year] through 1964. ? Anyone with known risk factors for hepatitis C.  Sexually transmitted infections (STIs)  You should be screened for sexually transmitted infections (STIs) including gonorrhea and chlamydia if: ? You are sexually active and are younger than 56 years of age. ? You are older than 56 years of age and your health care provider tells you that you are at risk for this type of infection. ? Your sexual activity has changed since you were last screened and you are at an increased risk for chlamydia or gonorrhea. Ask your health care provider if you are at risk.  If you do not have HIV, but are at risk, it may be recommended that you take a prescription medicine daily to prevent HIV infection. This is called  pre-exposure prophylaxis (PrEP). You are considered at risk if: ? You are sexually active and do not regularly use condoms or know the HIV status of your partner(s). ? You take drugs by injection. ? You are sexually active with a partner who has HIV.  Talk with your health care provider about whether you are at high risk of being infected with HIV. If you choose to begin PrEP, you  should first be tested for HIV. You should then be tested every 3 months for as long as you are taking PrEP. Pregnancy  If you are premenopausal and you may become pregnant, ask your health care provider about preconception counseling.  If you may become pregnant, take 400 to 800 micrograms (mcg) of folic acid every day.  If you want to prevent pregnancy, talk to your health care provider about birth control (contraception). Osteoporosis and menopause  Osteoporosis is a disease in which the bones lose minerals and strength with aging. This can result in serious bone fractures. Your risk for osteoporosis can be identified using a bone density scan.  If you are 69 years of age or older, or if you are at risk for osteoporosis and fractures, ask your health care provider if you should be screened.  Ask your health care provider whether you should take a calcium or vitamin D supplement to lower your risk for osteoporosis.  Menopause may have certain physical symptoms and risks.  Hormone replacement therapy may reduce some of these symptoms and risks. Talk to your health care provider about whether hormone replacement therapy is right for you. Follow these instructions at home:  Schedule regular health, dental, and eye exams.  Stay current with your immunizations.  Do not use any tobacco products including cigarettes, chewing tobacco, or electronic cigarettes.  If you are pregnant, do not drink alcohol.  If you are breastfeeding, limit how much and how often you drink alcohol.  Limit alcohol intake to no more  than 1 drink per day for nonpregnant women. One drink equals 12 ounces of beer, 5 ounces of wine, or 1 ounces of hard liquor.  Do not use street drugs.  Do not share needles.  Ask your health care provider for help if you need support or information about quitting drugs.  Tell your health care provider if you often feel depressed.  Tell your health care provider if you have ever been abused or do not feel safe at home. This information is not intended to replace advice given to you by your health care provider. Make sure you discuss any questions you have with your health care provider. Document Released: 12/29/2010 Document Revised: 11/21/2015 Document Reviewed: 03/19/2015 Elsevier Interactive Patient Education  [employer].

## 2018-05-06 NOTE — Assessment & Plan Note (Signed)
Refill for phentermine/topiramate and topiramate to help with evening cravings.

## 2018-05-09 ENCOUNTER — Telehealth: Payer: Self-pay | Admitting: Internal Medicine

## 2018-05-09 NOTE — Telephone Encounter (Signed)
Copied from CRM [id number]. Topic: General - Other >> May 09, 2018  3:53 PM Herby Abraham C wrote: Reason for CRM: pt would like to have her Rx for Phentermine-Topiramate 15-92 MG CP24 sent to the [facility] on [address], [place] instead. Pt says that she receive discounts at [facility] for this medication.   Please assist, pt would like a call once complete.

## 2018-05-10 MED ORDER — PHENTERMINE-TOPIRAMATE ER 15-92 MG PO CP24: 1.0000 | ORAL_CAPSULE | Freq: Every day | ORAL | 1 refills | Status: DC

## 2018-05-10 NOTE — Telephone Encounter (Signed)
Patient informed medication was sent to [facility]

## 2018-06-21 ENCOUNTER — Encounter: Payer: Self-pay | Admitting: Internal Medicine

## 2018-06-21 NOTE — Progress Notes (Signed)
Abstracted and sent to scan  

## 2018-07-08 ENCOUNTER — Ambulatory Visit: Payer: Self-pay

## 2018-07-08 ENCOUNTER — Ambulatory Visit (INDEPENDENT_AMBULATORY_CARE_PROVIDER_SITE_OTHER): Payer: Managed Care, Other (non HMO)

## 2018-07-08 DIAGNOSIS — Z23 Encounter for immunization: Secondary | ICD-10-CM

## 2018-07-08 DIAGNOSIS — Z299 Encounter for prophylactic measures, unspecified: Secondary | ICD-10-CM

## 2018-11-04 ENCOUNTER — Ambulatory Visit: Payer: Self-pay | Admitting: Internal Medicine

## 2018-11-12 ENCOUNTER — Other Ambulatory Visit: Payer: Self-pay

## 2018-11-12 ENCOUNTER — Encounter (HOSPITAL_COMMUNITY): Payer: Self-pay | Admitting: Emergency Medicine

## 2018-11-12 ENCOUNTER — Emergency Department (HOSPITAL_COMMUNITY)
Admission: EM | Admit: 2018-11-12 | Discharge: 2018-11-12 | Disposition: A | Discharge status: Home / Self Care | Payer: Managed Care, Other (non HMO) | Attending: Emergency Medicine | Admitting: Emergency Medicine

## 2018-11-12 ENCOUNTER — Emergency Department (HOSPITAL_COMMUNITY): Payer: Managed Care, Other (non HMO)

## 2018-11-12 DIAGNOSIS — M5431 Sciatica, right side: Secondary | ICD-10-CM | POA: Diagnosis not present

## 2018-11-12 DIAGNOSIS — Z79899 Other long term (current) drug therapy: Secondary | ICD-10-CM | POA: Insufficient documentation

## 2018-11-12 DIAGNOSIS — Y999 Unspecified external cause status: Secondary | ICD-10-CM | POA: Insufficient documentation

## 2018-11-12 DIAGNOSIS — S96911A Strain of unspecified muscle and tendon at ankle and foot level, right foot, initial encounter: Secondary | ICD-10-CM | POA: Insufficient documentation

## 2018-11-12 DIAGNOSIS — Y92410 Unspecified street and highway as the place of occurrence of the external cause: Secondary | ICD-10-CM | POA: Insufficient documentation

## 2018-11-12 DIAGNOSIS — S99921A Unspecified injury of right foot, initial encounter: Secondary | ICD-10-CM | POA: Diagnosis present

## 2018-11-12 DIAGNOSIS — Y939 Activity, unspecified: Secondary | ICD-10-CM | POA: Insufficient documentation

## 2018-11-12 MED ORDER — HYDROCODONE-ACETAMINOPHEN 5-325 MG PO TABS: 1.0000 | ORAL_TABLET | ORAL | 0 refills | Status: DC | PRN

## 2018-11-12 MED ORDER — KETOROLAC TROMETHAMINE 30 MG/ML IJ SOLN
30.0000 mg | Freq: Once | INTRAMUSCULAR | Status: DC
  Filled 2018-11-12: qty 1

## 2018-11-12 MED ORDER — DEXAMETHASONE SODIUM PHOSPHATE 10 MG/ML IJ SOLN
10.0000 mg | Freq: Once | INTRAMUSCULAR | Status: DC
  Filled 2018-11-12: qty 1

## 2018-11-12 MED ORDER — PREDNISONE 10 MG (21) PO TBPK: ORAL_TABLET | ORAL | 0 refills | Status: DC
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
## 2019-03-21 ENCOUNTER — Other Ambulatory Visit: Payer: Self-pay | Admitting: Internal Medicine

## 2019-04-14 ENCOUNTER — Emergency Department (HOSPITAL_COMMUNITY): Payer: Managed Care, Other (non HMO)

## 2019-04-14 ENCOUNTER — Encounter (HOSPITAL_COMMUNITY): Payer: Self-pay | Admitting: Emergency Medicine

## 2019-04-14 ENCOUNTER — Emergency Department (HOSPITAL_COMMUNITY)
Admission: EM | Admit: 2019-04-14 | Discharge: 2019-04-14 | Disposition: A | Discharge status: Home / Self Care | Payer: Managed Care, Other (non HMO) | Attending: Emergency Medicine | Admitting: Emergency Medicine

## 2019-04-14 DIAGNOSIS — Z79899 Other long term (current) drug therapy: Secondary | ICD-10-CM | POA: Insufficient documentation

## 2019-04-14 DIAGNOSIS — Y9289 Other specified places as the place of occurrence of the external cause: Secondary | ICD-10-CM | POA: Diagnosis not present

## 2019-04-14 DIAGNOSIS — Y9389 Activity, other specified: Secondary | ICD-10-CM | POA: Diagnosis not present

## 2019-04-14 DIAGNOSIS — Y999 Unspecified external cause status: Secondary | ICD-10-CM | POA: Insufficient documentation

## 2019-04-14 DIAGNOSIS — M62838 Other muscle spasm: Secondary | ICD-10-CM

## 2019-04-14 DIAGNOSIS — M25512 Pain in left shoulder: Secondary | ICD-10-CM | POA: Diagnosis present

## 2019-04-14 MED ORDER — MELOXICAM 7.5 MG PO TABS: 7.5000 mg | ORAL_TABLET | Freq: Every day | ORAL | 0 refills | Status: DC

## 2019-04-14 MED ORDER — METHOCARBAMOL 500 MG PO TABS: 500.0000 mg | ORAL_TABLET | Freq: Two times a day (BID) | ORAL | 0 refills | Status: DC

## 2019-04-14 MED ORDER — DICLOFENAC SODIUM 1 % TD GEL: 2.0000 g | Freq: Four times a day (QID) | TRANSDERMAL | 0 refills | Status: DC

## 2019-04-14 NOTE — Discharge Instructions (Signed)
Take Mobic once daily as prescribed for your pain and inflammation.  Take Robaxin twice daily as needed for muscle pain or spasms.  Do not drive or operate machinery while taking this medication.  Apply Voltaren gel to the area of pain 4 times daily.  Use ice and heat alternating 20 minutes on, 20 minutes off.  Attempt the exercises and stretches we discussed.  Please follow-up with the orthopedic doctor or your doctor if your symptoms are not improving.
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
## 2019-05-12 ENCOUNTER — Encounter: Payer: Managed Care, Other (non HMO) | Admitting: Internal Medicine

## 2019-06-09 ENCOUNTER — Encounter: Payer: Self-pay | Admitting: Internal Medicine

## 2019-06-09 ENCOUNTER — Other Ambulatory Visit: Payer: Self-pay

## 2019-06-09 ENCOUNTER — Ambulatory Visit (INDEPENDENT_AMBULATORY_CARE_PROVIDER_SITE_OTHER): Payer: Managed Care, Other (non HMO) | Admitting: Internal Medicine

## 2019-06-09 VITALS — BP 122/84 | HR 60 | Temp 98.6°F | Ht 62.0 in | Wt 182.0 lb

## 2019-06-09 DIAGNOSIS — Z23 Encounter for immunization: Secondary | ICD-10-CM | POA: Diagnosis not present

## 2019-06-09 DIAGNOSIS — R030 Elevated blood-pressure reading, without diagnosis of hypertension: Secondary | ICD-10-CM

## 2019-06-09 DIAGNOSIS — Z0000 Encounter for general adult medical examination without abnormal findings: Secondary | ICD-10-CM | POA: Diagnosis not present
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
## 2019-06-09 NOTE — Patient Instructions (Addendum)
High-Fiber Diet Fiber, also called dietary fiber, is a type of carbohydrate that is found in fruits, vegetables, whole grains, and beans. A high-fiber diet can have many health benefits. Your health care provider may recommend a high-fiber diet to help:  Prevent constipation. Fiber can make your bowel movements more regular.  Lower your cholesterol.  Relieve the following conditions: ? Swelling of veins in the anus (hemorrhoids). ? Swelling and irritation (inflammation) of specific areas of the digestive tract (uncomplicated diverticulosis). ? A problem of the large intestine (colon) that sometimes causes pain and diarrhea (irritable bowel syndrome, IBS).  Prevent overeating as part of a weight-loss plan.  Prevent heart disease, type 2 diabetes, and certain cancers. What is my plan? The recommended daily fiber intake in grams (g) includes:  57 g for men age 57 or younger.  57 g for men over age 57.  57 g for women age 57 or younger.  57 g for women over age 57. You can get the recommended daily intake of dietary fiber by:  Eating a variety of fruits, vegetables, grains, and beans.  Taking a fiber supplement, if it is not possible to get enough fiber through your diet. What do I need to know about a high-fiber diet?  It is better to get fiber through food sources rather than from fiber supplements. There is not a lot of research about how effective supplements are.  Always check the fiber content on the nutrition facts label of any prepackaged food. Look for foods that contain 5 g of fiber or more per serving.  Talk with a diet and nutrition specialist (dietitian) if you have questions about specific foods that are recommended or not recommended for your medical condition, especially if those foods are not listed below.  Gradually increase how much fiber you consume. If you increase your intake of dietary fiber too quickly, you may have bloating, cramping, or gas.  Drink plenty  of water. Water helps you to digest fiber. What are tips for following this plan?  Eat a wide variety of high-fiber foods.  Make sure that half of the grains that you eat each day are whole grains.  Eat breads and cereals that are made with whole-grain flour instead of refined flour or white flour.  Eat brown rice, bulgur wheat, or millet instead of white rice.  Start the day with a breakfast that is high in fiber, such as a cereal that contains 5 g of fiber or more per serving.  Use beans in place of meat in soups, salads, and pasta dishes.  Eat high-fiber snacks, such as berries, raw vegetables, nuts, and popcorn.  Choose whole fruits and vegetables instead of processed forms like juice or sauce. What foods can I eat?  Fruits Berries. Pears. Apples. Oranges. Avocado. Prunes and raisins. Dried figs. Vegetables Sweet potatoes. Spinach. Kale. Artichokes. Cabbage. Broccoli. Cauliflower. Green peas. Carrots. Squash. Grains Whole-grain breads. Multigrain cereal. Oats and oatmeal. Brown rice. Barley. Bulgur wheat. Millet. Quinoa. Bran muffins. Popcorn. Rye wafer crackers. Meats and other proteins Navy, kidney, and pinto beans. Soybeans. Split peas. Lentils. Nuts and seeds. Dairy Fiber-fortified yogurt. Beverages Fiber-fortified soy milk. Fiber-fortified orange juice. Other foods Fiber bars. The items listed above may not be a complete list of recommended foods and beverages. Contact a dietitian for more options. What foods are not recommended? Fruits Fruit juice. Cooked, strained fruit. Vegetables Fried potatoes. Canned vegetables. Well-cooked vegetables. Grains White bread. Pasta made with refined flour. White rice. Meats and other   proteins Fatty cuts of meat. Fried chicken or fried fish. Dairy Milk. Yogurt. Cream cheese. Sour cream. Fats and oils Butters. Beverages Soft drinks. Other foods Cakes and pastries. The items listed above may not be a complete list of foods  and beverages to avoid. Contact a dietitian for more information. Summary  Fiber is a type of carbohydrate. It is found in fruits, vegetables, whole grains, and beans.  There are many health benefits of eating a high-fiber diet, such as preventing constipation, lowering blood cholesterol, helping with weight loss, and reducing your risk of heart disease, diabetes, and certain cancers.  Gradually increase your intake of fiber. Increasing too fast can result in cramping, bloating, and gas. Drink plenty of water while you increase your fiber.  The best sources of fiber include whole fruits and vegetables, whole grains, nuts, seeds, and beans. This information is not intended to replace advice given to you by your health care provider. Make sure you discuss any questions you have with your health care provider. Document Released: 06/15/2005 Document Revised: 04/19/2017 Document Reviewed: 04/19/2017 Elsevier Patient Education  2020 [place] Maintenance, Female Adopting a healthy lifestyle and getting preventive care are important in promoting health and wellness. Ask your health care provider about:  The right schedule for you to have regular tests and exams.  Things you can do on your own to prevent diseases and keep yourself healthy. What should I know about diet, weight, and exercise? Eat a healthy diet   Eat a diet that includes plenty of vegetables, fruits, low-fat dairy products, and lean protein.  Do not eat a lot of foods that are high in solid fats, added sugars, or sodium. Maintain a healthy weight Body mass index (BMI) is used to identify weight problems. It estimates body fat based on height and weight. Your health care provider can help determine your BMI and help you achieve or maintain a healthy weight. Get regular exercise Get regular exercise. This is one of the most important things you can do for your health. Most adults should:  Exercise for at least 150  minutes each week. The exercise should increase your heart rate and make you sweat (moderate-intensity exercise).  Do strengthening exercises at least twice a week. This is in addition to the moderate-intensity exercise.  Spend less time sitting. Even light physical activity can be beneficial. Watch cholesterol and blood lipids Have your blood tested for lipids and cholesterol at 57 years of age, then have this test every 5 years. Have your cholesterol levels checked more often if:  Your lipid or cholesterol levels are high.  You are older than 57 years of age.  You are at high risk for heart disease. What should I know about cancer screening? Depending on your health history and family history, you may need to have cancer screening at various ages. This may include screening for:  Breast cancer.  Cervical cancer.  Colorectal cancer.  Skin cancer.  Lung cancer. What should I know about heart disease, diabetes, and high blood pressure? Blood pressure and heart disease  High blood pressure causes heart disease and increases the risk of stroke. This is more likely to develop in people who have high blood pressure readings, are of African descent, or are overweight.  Have your blood pressure checked: ? Every 3-5 years if you are 70-20 years of age. ? Every year if you are 73 years old or older. Diabetes Have regular diabetes screenings. This checks your fasting  blood sugar level. Have the screening done:  Once every three years after age 70 if you are at a normal weight and have a low risk for diabetes.  More often and at a younger age if you are overweight or have a high risk for diabetes. What should I know about preventing infection? Hepatitis B If you have a higher risk for hepatitis B, you should be screened for this virus. Talk with your health care provider to find out if you are at risk for hepatitis B infection. Hepatitis C Testing is recommended for:  Everyone born  from [birth year] through 1964.  Anyone with known risk factors for hepatitis C. Sexually transmitted infections (STIs)  Get screened for STIs, including gonorrhea and chlamydia, if: ? You are sexually active and are younger than 57 years of age. ? You are older than 57 years of age and your health care provider tells you that you are at risk for this type of infection. ? Your sexual activity has changed since you were last screened, and you are at increased risk for chlamydia or gonorrhea. Ask your health care provider if you are at risk.  Ask your health care provider about whether you are at high risk for HIV. Your health care provider may recommend a prescription medicine to help prevent HIV infection. If you choose to take medicine to prevent HIV, you should first get tested for HIV. You should then be tested every 3 months for as long as you are taking the medicine. Pregnancy  If you are about to stop having your period (premenopausal) and you may become pregnant, seek counseling before you get pregnant.  Take 400 to 800 micrograms (mcg) of folic acid every day if you become pregnant.  Ask for birth control (contraception) if you want to prevent pregnancy. Osteoporosis and menopause Osteoporosis is a disease in which the bones lose minerals and strength with aging. This can result in bone fractures. If you are 49 years old or older, or if you are at risk for osteoporosis and fractures, ask your health care provider if you should:  Be screened for bone loss.  Take a calcium or vitamin D supplement to lower your risk of fractures.  Be given hormone replacement therapy (HRT) to treat symptoms of menopause. Follow these instructions at home: Lifestyle  Do not use any products that contain nicotine or tobacco, such as cigarettes, e-cigarettes, and chewing tobacco. If you need help quitting, ask your health care provider.  Do not use street drugs.  Do not share needles.  Ask your health  care provider for help if you need support or information about quitting drugs. Alcohol use  Do not drink alcohol if: ? Your health care provider tells you not to drink. ? You are pregnant, may be pregnant, or are planning to become pregnant.  If you drink alcohol: ? Limit how much you use to 0-1 drink a day. ? Limit intake if you are breastfeeding.  Be aware of how much alcohol is in your drink. In the [place] one drink equals one 12 oz bottle of beer (355 mL), one 5 oz glass of wine (148 mL), or one 1 oz glass of hard liquor (44 mL). General instructions  Schedule regular health, dental, and eye exams.  Stay current with your vaccines.  Tell your health care provider if: ? You often feel depressed. ? You have ever been abused or do not feel safe at home. Summary  Adopting a healthy lifestyle and  getting preventive care are important in promoting health and wellness.  Follow your health care provider's instructions about healthy diet, exercising, and getting tested or screened for diseases.  Follow your health care provider's instructions on monitoring your cholesterol and blood pressure. This information is not intended to replace advice given to you by your health care provider. Make sure you discuss any questions you have with your health care provider. Document Released: 12/29/2010 Document Revised: 06/08/2018 Document Reviewed: 06/08/2018 Elsevier Patient Education  2020 Elsevier Inc.  

## 2019-06-09 NOTE — Assessment & Plan Note (Signed)
Normal today, will continue to monitor

## 2019-06-09 NOTE — Progress Notes (Signed)
   Subjective:   Patient ID: Kayla Barnett, female    DOB: 1960-07-10, 58 y.o.   MRN: [id number]  HPI The patient is a 58 YO female coming in for physical.   PMH, [place], social history reviewed and updated  Review of Systems  Constitutional: Negative.   HENT: Negative.   Eyes: Negative.   Respiratory: Negative for cough, chest tightness and shortness of breath.   Cardiovascular: Negative for chest pain, palpitations and leg swelling.  Gastrointestinal: Negative for abdominal distention, abdominal pain, constipation, diarrhea, nausea and vomiting.  Musculoskeletal: Negative.   Skin: Negative.   Neurological: Negative.   Psychiatric/Behavioral: Negative.     Objective:  Physical Exam Constitutional:      Appearance: She is well-developed.  HENT:     Head: Normocephalic and atraumatic.  Cardiovascular:     Rate and Rhythm: Normal rate and regular rhythm.  Pulmonary:     Effort: Pulmonary effort is normal. No respiratory distress.     Breath sounds: Normal breath sounds. No wheezing or rales.  Abdominal:     General: Bowel sounds are normal. There is no distension.     Palpations: Abdomen is soft.     Tenderness: There is no abdominal tenderness. There is no rebound.  Musculoskeletal:     Cervical back: Normal range of motion.  Skin:    General: Skin is warm and dry.  Neurological:     Mental Status: She is alert and oriented to person, place, and time.     Coordination: Coordination normal.     Vitals:   06/09/19 0940  BP: 122/84  Pulse: 60  Temp: 98.6 F (37 C)  TempSrc: Oral  SpO2: 98%  Weight: 182 lb (82.6 kg)  Height: 5\' 2"  (1.575 m)    This visit occurred during the SARS-CoV-2 public health emergency.  Safety protocols were in place, including screening questions prior to the visit, additional usage of staff PPE, and extensive cleaning of exam room while observing appropriate contact time as indicated for disinfecting solutions.   Assessment & Plan:    Flu shot given at visit

## 2019-06-15 ENCOUNTER — Other Ambulatory Visit: Payer: Self-pay | Admitting: Internal Medicine

## 2019-08-09 ENCOUNTER — Telehealth: Payer: Self-pay | Admitting: Internal Medicine

## 2019-08-09 MED ORDER — TOPIRAMATE 50 MG PO TABS: ORAL_TABLET | ORAL | 3 refills | Status: DC

## 2019-08-09 NOTE — Telephone Encounter (Signed)
    Patient calling to request refill on Qsymia and topiramate (TOPAMAX) 50 MG tablet Pharmacy :[facility]

## 2019-08-14 ENCOUNTER — Telehealth: Payer: Self-pay

## 2019-08-14 NOTE — Telephone Encounter (Signed)
New message   1. Which medications need to be refilled? (please list name of each medication and dose if known) Qsymia ?  mg   2. Which pharmacy/location (including street and city if local pharmacy) is medication to be sent to? [facility] [place]   3. Do they need a 30 day or 90 day supply? 90 days supply

## 2019-08-15 NOTE — Telephone Encounter (Signed)
She told me she was not taking, if she asking to start qsymia?

## 2019-08-15 NOTE — Telephone Encounter (Signed)
Called pt, LVM.   

## 2019-08-23 ENCOUNTER — Telehealth: Payer: Self-pay

## 2019-08-23 NOTE — Telephone Encounter (Signed)
Pt has called back. She stated that she was not taking it but she would like to start.

## 2019-08-23 NOTE — Telephone Encounter (Signed)
New message   The patient is returning call back to the CMA from  08/16/19.   Please advise

## 2019-08-24 MED ORDER — QSYMIA 7.5-46 MG PO CP24: 1.0000 | ORAL_CAPSULE | Freq: Every day | ORAL | 2 refills | Status: DC

## 2019-08-24 NOTE — Addendum Note (Signed)
Addended by: Hillard Danker A on: 08/24/2019 01:39 PM   Modules accepted: Orders

## 2019-08-24 NOTE — Telephone Encounter (Signed)
Sent in for her. Needs visit within 3 months after starting to measure effectiveness.

## 2019-08-24 NOTE — Telephone Encounter (Signed)
Pt informed.   OV has ben made.

## 2019-09-21 ENCOUNTER — Ambulatory Visit: Payer: Self-pay

## 2019-09-22 ENCOUNTER — Ambulatory Visit: Payer: Managed Care, Other (non HMO) | Attending: Internal Medicine

## 2019-09-22 DIAGNOSIS — Z23 Encounter for immunization: Secondary | ICD-10-CM

## 2019-09-22 NOTE — Progress Notes (Signed)
   Covid-19 Vaccination Clinic  Name:  Kayla Barnett    MRN: [id number] DOB: June 10, 1961  09/22/2019  Kayla Barnett was observed post Covid-19 immunization for 15 minutes without incident. She was provided with Vaccine Information Sheet and instruction to access the V-Safe system.   Kayla Barnett was instructed to call 911 with any severe reactions post vaccine: Marland Kitchen Difficulty breathing  . Swelling of face and throat  . A fast heartbeat  . A bad rash all over body  . Dizziness and weakness   Immunizations Administered    Name Date Dose VIS Date Route   Pfizer COVID-19 Vaccine 09/22/2019  9:16 AM 0.3 mL 06/09/2019 Intramuscular   Manufacturer: [employer], [employer]   Lot: [id number]   NDC: [address]-0

## 2019-10-18 ENCOUNTER — Ambulatory Visit: Payer: Self-pay

## 2019-10-18 ENCOUNTER — Ambulatory Visit: Payer: Managed Care, Other (non HMO) | Attending: Internal Medicine

## 2019-10-18 DIAGNOSIS — Z23 Encounter for immunization: Secondary | ICD-10-CM

## 2019-10-18 NOTE — Progress Notes (Signed)
   Covid-19 Vaccination Clinic  Name:  Kayla Barnett    MRN: [id number] DOB: 02-18-1961  10/18/2019  [name] was observed post Covid-19 immunization for 15 minutes without incident. She was provided with Vaccine Information Sheet and instruction to access the V-Safe system.   [name] was instructed to call 911 with any severe reactions post vaccine: Marland Kitchen Difficulty breathing  . Swelling of face and throat  . A fast heartbeat  . A bad rash all over body  . Dizziness and weakness   Immunizations Administered    Name Date Dose VIS Date Route   Pfizer COVID-19 Vaccine 10/18/2019 11:22 AM 0.3 mL 08/23/2018 Intramuscular   Manufacturer: [employer], [employer]   Lot: [id number]   NDC: [address]-4

## 2019-11-23 ENCOUNTER — Ambulatory Visit: Payer: Managed Care, Other (non HMO) | Admitting: Internal Medicine

## 2019-12-22 ENCOUNTER — Other Ambulatory Visit: Payer: Self-pay

## 2019-12-22 ENCOUNTER — Ambulatory Visit (INDEPENDENT_AMBULATORY_CARE_PROVIDER_SITE_OTHER): Payer: Managed Care, Other (non HMO) | Admitting: Internal Medicine

## 2019-12-22 ENCOUNTER — Encounter: Payer: Self-pay | Admitting: Internal Medicine

## 2019-12-22 DIAGNOSIS — E669 Obesity, unspecified: Secondary | ICD-10-CM | POA: Diagnosis not present

## 2019-12-22 MED ORDER — QSYMIA 15-92 MG PO CP24: 1.0000 | ORAL_CAPSULE | Freq: Every day | ORAL | 2 refills | Status: DC

## 2019-12-22 NOTE — Progress Notes (Signed)
   Subjective:   Patient ID: Kayla Barnett, female    DOB: [birth year], 59 y.o.   MRN: [id number]  HPI The patient is a 59 YO female coming in for concerns about weight. Has tried qsymia without side effect for a month or two. Did not have problems but felt she did not get the benefit of when she has taken this in the past. She denies eating changes although she is eating much healthier and watching carbs. She has also started exercising and feels clothes are fitting better but weight is stable. She is frustrated and wants to talk about options for treatment.   Review of Systems  Constitutional: Negative.   HENT: Negative.   Eyes: Negative.   Respiratory: Negative for cough, chest tightness and shortness of breath.   Cardiovascular: Negative for chest pain, palpitations and leg swelling.  Gastrointestinal: Negative for abdominal distention, abdominal pain, constipation, diarrhea, nausea and vomiting.  Musculoskeletal: Negative.   Skin: Negative.   Neurological: Negative.   Psychiatric/Behavioral: Negative.     Objective:  Physical Exam Constitutional:      Appearance: She is well-developed.  HENT:     Head: Normocephalic and atraumatic.  Cardiovascular:     Rate and Rhythm: Normal rate and regular rhythm.  Pulmonary:     Effort: Pulmonary effort is normal. No respiratory distress.     Breath sounds: Normal breath sounds. No wheezing or rales.  Abdominal:     General: Bowel sounds are normal. There is no distension.     Palpations: Abdomen is soft.     Tenderness: There is no abdominal tenderness. There is no rebound.  Musculoskeletal:     Cervical back: Normal range of motion.  Skin:    General: Skin is warm and dry.  Neurological:     Mental Status: She is alert and oriented to person, place, and time.     Coordination: Coordination normal.     Vitals:   12/22/19 1610  BP: 118/78  Pulse: 64  Temp: 98.6 F (37 C)  TempSrc: Oral  SpO2: 98%  Weight: 183 lb (83 kg)    Height: 5\' 2"  (1.575 m)    This visit occurred during the SARS-CoV-2 public health emergency.  Safety protocols were in place, including screening questions prior to the visit, additional usage of staff PPE, and extensive cleaning of exam room while observing appropriate contact time as indicated for disinfecting solutions.   Assessment & Plan:  Visit time 25 minutes in face to face communication with patient and coordination of care, additional 5 minutes spent in record review, coordination or care, ordering tests, communicating/referring to other healthcare professionals, documenting in medical records all on the same day of the visit for total time 30 minutes spent on the visit.

## 2019-12-22 NOTE — Assessment & Plan Note (Signed)
Increase dose of qsymia to 15/92 mg daily to see if this can help with her weight loss and we talked about checking thyroid and vitamin levels if this is unable to help her lose weight. We talked extensively about genetic linkages and evidence regarding dietary changes and different diets during the visit.

## 2019-12-22 NOTE — Patient Instructions (Addendum)
We will send in the higher dose strength of the qsymia.

## 2020-08-06 ENCOUNTER — Other Ambulatory Visit: Payer: Self-pay | Admitting: Internal Medicine

## 2020-08-06 NOTE — Telephone Encounter (Signed)
LR: 12-22-2019 Qty: 30 with 2 refills  Last office visit: 12-22-2019 Upcoming appointment: No pending appt

## 2020-08-07 NOTE — Telephone Encounter (Signed)
Would need visit for refill. Typically I require a visit within 3 months of starting a weight loss medication to assess if it is working and if dosing adjustment is needed.

## 2020-12-31 ENCOUNTER — Other Ambulatory Visit: Payer: Self-pay

## 2020-12-31 ENCOUNTER — Encounter: Payer: Self-pay | Admitting: Internal Medicine

## 2020-12-31 ENCOUNTER — Ambulatory Visit (INDEPENDENT_AMBULATORY_CARE_PROVIDER_SITE_OTHER): Payer: Managed Care, Other (non HMO) | Admitting: Internal Medicine

## 2020-12-31 VITALS — BP 122/70 | HR 90 | Temp 98.4°F | Resp 18 | Ht 62.0 in | Wt 194.0 lb

## 2020-12-31 DIAGNOSIS — R635 Abnormal weight gain: Secondary | ICD-10-CM

## 2020-12-31 DIAGNOSIS — E669 Obesity, unspecified: Secondary | ICD-10-CM | POA: Diagnosis not present

## 2020-12-31 DIAGNOSIS — R030 Elevated blood-pressure reading, without diagnosis of hypertension: Secondary | ICD-10-CM | POA: Diagnosis not present

## 2020-12-31 DIAGNOSIS — Z0000 Encounter for general adult medical examination without abnormal findings: Secondary | ICD-10-CM | POA: Diagnosis not present

## 2020-12-31 LAB — COMPREHENSIVE METABOLIC PANEL
ALT: 18 U/L (ref 0–35)
AST: 30 U/L (ref 0–37)
Albumin: 4.4 g/dL (ref 3.5–5.2)
Alkaline Phosphatase: 57 U/L (ref 39–117)
BUN: 11 mg/dL (ref 6–23)
CO2: 25 mEq/L (ref 19–32)
Calcium: 9.7 mg/dL (ref 8.4–10.5)
Chloride: 103 mEq/L (ref 96–112)
Creatinine, Ser: 0.96 mg/dL (ref 0.40–1.20)
GFR: 64.88 mL/min (ref >60.00)
Glucose, Bld: 74 mg/dL (ref 70–99)
Potassium: 3.7 mEq/L (ref 3.5–5.1)
Sodium: 137 mEq/L (ref 135–145)
Total Bilirubin: 1 mg/dL (ref 0.2–1.2)
Total Protein: 7.9 g/dL (ref 6.0–8.3)

## 2020-12-31 LAB — CBC
HCT: 42.4 % (ref 36.0–46.0)
Hemoglobin: 14.1 g/dL (ref 12.0–15.0)
MCHC: 33.2 g/dL (ref 30.0–36.0)
MCV: 90.6 fl (ref 78.0–100.0)
Platelets: 150 10*3/uL (ref 150.0–400.0)
RBC: 4.69 Mil/uL (ref 3.87–5.11)
RDW: 14.3 % (ref 11.5–15.5)
WBC: 3.2 10*3/uL — ABNORMAL LOW (ref 4.0–10.5)

## 2020-12-31 LAB — LIPID PANEL
Cholesterol: 229 mg/dL — ABNORMAL HIGH (ref 0–200)
HDL: 61.5 mg/dL (ref >39.00)
LDL Cholesterol: 154 mg/dL — ABNORMAL HIGH (ref 0–99)
NonHDL: 167.66
Total CHOL/HDL Ratio: 4
Triglycerides: 70 mg/dL (ref 0.0–149.0)
VLDL: 14 mg/dL (ref 0.0–40.0)

## 2020-12-31 LAB — VITAMIN B12: Vitamin B-12: >1550 pg/mL — ABNORMAL HIGH (ref 211–911)

## 2020-12-31 LAB — VITAMIN D 25 HYDROXY (VIT D DEFICIENCY, FRACTURES): VITD: 36.79 ng/mL (ref 30.00–100.00)

## 2020-12-31 LAB — HEMOGLOBIN A1C: Hgb A1c MFr Bld: 6.2 % (ref 4.6–6.5)

## 2020-12-31 LAB — TSH: TSH: 5.44 u[IU]/mL (ref 0.35–5.50)

## 2020-12-31 NOTE — Progress Notes (Signed)
   Subjective:   Patient ID: Kayla Barnett, female    DOB: 01-16-1961, 60 y.o.   MRN: [id number]  HPI The patient is a 60 YO female coming in for physical.  PMH, FMH, social history reviewed and updated  Review of Systems  Constitutional: Negative.   HENT: Negative.    Eyes: Negative.   Respiratory:  Negative for cough, chest tightness and shortness of breath.   Cardiovascular:  Negative for chest pain, palpitations and leg swelling.  Gastrointestinal:  Negative for abdominal distention, abdominal pain, constipation, diarrhea, nausea and vomiting.  Musculoskeletal: Negative.   Skin: Negative.   Neurological: Negative.   Psychiatric/Behavioral: Negative.     Objective:  Physical Exam Constitutional:      Appearance: She is well-developed.  HENT:     Head: Normocephalic and atraumatic.  Cardiovascular:     Rate and Rhythm: Normal rate and regular rhythm.  Pulmonary:     Effort: Pulmonary effort is normal. No respiratory distress.     Breath sounds: Normal breath sounds. No wheezing or rales.  Abdominal:     General: Bowel sounds are normal. There is no distension.     Palpations: Abdomen is soft.     Tenderness: There is no abdominal tenderness. There is no rebound.  Musculoskeletal:     Cervical back: Normal range of motion.  Skin:    General: Skin is warm and dry.  Neurological:     Mental Status: She is alert and oriented to person, place, and time.     Coordination: Coordination normal.    Vitals:   12/31/20 1530  BP: 122/70  Pulse: 90  Resp: 18  Temp: 98.4 F (36.9 C)  TempSrc: Oral  SpO2: 97%  Weight: 194 lb (88 kg)  Height: 5\' 2"  (1.575 m)    This visit occurred during the SARS-CoV-2 public health emergency.  Safety protocols were in place, including screening questions prior to the visit, additional usage of staff PPE, and extensive cleaning of exam room while observing appropriate contact time as indicated for disinfecting solutions.   Assessment &  Plan:

## 2021-01-03 NOTE — Assessment & Plan Note (Signed)
Flu shot yearly. Covid-19 2 shots counseled about booster. Shingrix complete. Tetanus up to date. Colonoscopy up to date. Mammogram up to date with gyn, pap smear up to date with gyn. Counseled about sun safety and mole surveillance. Counseled about the dangers of distracted driving. Given 10 year screening recommendations.

## 2021-01-03 NOTE — Assessment & Plan Note (Signed)
BP at goal off meds but will need close monitoring.

## 2021-01-03 NOTE — Assessment & Plan Note (Signed)
Checking labs for complications and for secondary contributors for weight including TSH, vitamin B12 and vitamin D. Treat as appropriate. Talked about diet and exercise to help.
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
## 2022-01-02 ENCOUNTER — Encounter: Payer: Managed Care, Other (non HMO) | Admitting: Internal Medicine

## 2022-01-05 ENCOUNTER — Encounter: Payer: Self-pay | Admitting: Internal Medicine

## 2022-01-05 ENCOUNTER — Ambulatory Visit: Payer: Managed Care, Other (non HMO) | Admitting: Internal Medicine

## 2022-01-05 VITALS — BP 122/70 | HR 87 | Resp 18 | Ht 62.0 in | Wt 208.6 lb

## 2022-01-05 DIAGNOSIS — E669 Obesity, unspecified: Secondary | ICD-10-CM

## 2022-01-05 DIAGNOSIS — Z0000 Encounter for general adult medical examination without abnormal findings: Secondary | ICD-10-CM | POA: Diagnosis not present

## 2022-01-05 DIAGNOSIS — Z6838 Body mass index (BMI) 38.0-38.9, adult: Secondary | ICD-10-CM | POA: Diagnosis not present

## 2022-01-05 DIAGNOSIS — R7303 Prediabetes: Secondary | ICD-10-CM

## 2022-01-05 LAB — COMPREHENSIVE METABOLIC PANEL
ALT: 15 U/L (ref 0–35)
AST: 23 U/L (ref 0–37)
Albumin: 4.3 g/dL (ref 3.5–5.2)
Alkaline Phosphatase: 63 U/L (ref 39–117)
BUN: 15 mg/dL (ref 6–23)
CO2: 29 mEq/L (ref 19–32)
Calcium: 9.8 mg/dL (ref 8.4–10.5)
Chloride: 102 mEq/L (ref 96–112)
Creatinine, Ser: 1.02 mg/dL (ref 0.40–1.20)
GFR: 59.9 mL/min — ABNORMAL LOW (ref >60.00)
Glucose, Bld: 85 mg/dL (ref 70–99)
Potassium: 4.3 mEq/L (ref 3.5–5.1)
Sodium: 139 mEq/L (ref 135–145)
Total Bilirubin: 0.6 mg/dL (ref 0.2–1.2)
Total Protein: 7.7 g/dL (ref 6.0–8.3)

## 2022-01-05 LAB — LIPID PANEL
Cholesterol: 224 mg/dL — ABNORMAL HIGH (ref 0–200)
HDL: 60.2 mg/dL (ref >39.00)
LDL Cholesterol: 138 mg/dL — ABNORMAL HIGH (ref 0–99)
NonHDL: 163.88
Total CHOL/HDL Ratio: 4
Triglycerides: 128 mg/dL (ref 0.0–149.0)
VLDL: 25.6 mg/dL (ref 0.0–40.0)

## 2022-01-05 LAB — HEMOGLOBIN A1C: Hgb A1c MFr Bld: 6.4 % (ref 4.6–6.5)

## 2022-01-05 LAB — CBC
HCT: 40.8 % (ref 36.0–46.0)
Hemoglobin: 13.3 g/dL (ref 12.0–15.0)
MCHC: 32.6 g/dL (ref 30.0–36.0)
MCV: 92 fl (ref 78.0–100.0)
Platelets: 173 10*3/uL (ref 150.0–400.0)
RBC: 4.44 Mil/uL (ref 3.87–5.11)
RDW: 14.4 % (ref 11.5–15.5)
WBC: 4.5 10*3/uL (ref 4.0–10.5)

## 2022-01-05 LAB — VITAMIN D 25 HYDROXY (VIT D DEFICIENCY, FRACTURES): VITD: 32.31 ng/mL (ref 30.00–100.00)

## 2022-01-05 MED ORDER — SEMAGLUTIDE-WEIGHT MANAGEMENT 1 MG/0.5ML ~~LOC~~ SOAJ: 1.0000 mg | SUBCUTANEOUS | 0 refills | Status: DC

## 2022-01-05 MED ORDER — SEMAGLUTIDE-WEIGHT MANAGEMENT 2.4 MG/0.75ML ~~LOC~~ SOAJ: 2.4000 mg | SUBCUTANEOUS | 0 refills | Status: DC

## 2022-01-05 MED ORDER — SEMAGLUTIDE-WEIGHT MANAGEMENT 0.25 MG/0.5ML ~~LOC~~ SOAJ: 0.2500 mg | SUBCUTANEOUS | 0 refills | Status: AC

## 2022-01-05 MED ORDER — SEMAGLUTIDE-WEIGHT MANAGEMENT 1.7 MG/0.75ML ~~LOC~~ SOAJ: 1.7000 mg | SUBCUTANEOUS | 0 refills | Status: DC

## 2022-01-05 MED ORDER — SEMAGLUTIDE-WEIGHT MANAGEMENT 0.5 MG/0.5ML ~~LOC~~ SOAJ: 0.5000 mg | SUBCUTANEOUS | 0 refills | Status: DC

## 2022-01-05 NOTE — Assessment & Plan Note (Signed)
Checking HgA1c and adjust as needed. We may do metformin for weight loss if wegovy is not covered.

## 2022-01-05 NOTE — Patient Instructions (Signed)
We have sent in wegovy to try for weight loss. If this is not approved we can try metformin.

## 2022-01-05 NOTE — Assessment & Plan Note (Signed)
Flu shot yearly. Covid-19 counseled. Shingrix complete. Tetanus up to date. Colonoscopy up to date. Mammogram up to date getting records, pap smear up to date getting records. Counseled about sun safety and mole surveillance. Counseled about the dangers of distracted driving. Given 10 year screening recommendations.

## 2022-01-05 NOTE — Assessment & Plan Note (Signed)
Weight is up about 15 pounds since last year. Rx wegovy titration to help. We discussed this may not be approved we can do metformin instead if not approved.

## 2022-01-05 NOTE — Progress Notes (Signed)
   Subjective:   Patient ID: Kayla Barnett, female    DOB: 1960/10/12, 61 y.o.   MRN: [id number]  HPI The patient is here for physical.  PMH, [facility], social history reviewed and updated  Review of Systems  Constitutional: Negative.   HENT: Negative.    Eyes: Negative.   Respiratory:  Negative for cough, chest tightness and shortness of breath.   Cardiovascular:  Negative for chest pain, palpitations and leg swelling.  Gastrointestinal:  Negative for abdominal distention, abdominal pain, constipation, diarrhea, nausea and vomiting.  Musculoskeletal: Negative.   Skin: Negative.   Neurological: Negative.   Psychiatric/Behavioral: Negative.      Objective:  Physical Exam Constitutional:      Appearance: She is well-developed.  HENT:     Head: Normocephalic and atraumatic.  Cardiovascular:     Rate and Rhythm: Normal rate and regular rhythm.  Pulmonary:     Effort: Pulmonary effort is normal. No respiratory distress.     Breath sounds: Normal breath sounds. No wheezing or rales.  Abdominal:     General: Bowel sounds are normal. There is no distension.     Palpations: Abdomen is soft.     Tenderness: There is no abdominal tenderness. There is no rebound.  Musculoskeletal:     Cervical back: Normal range of motion.  Skin:    General: Skin is warm and dry.  Neurological:     Mental Status: She is alert and oriented to person, place, and time.     Coordination: Coordination normal.     Vitals:   01/05/22 1522  BP: 122/70  Pulse: 87  Resp: 18  SpO2: 98%  Weight: 208 lb 9.6 oz (94.6 kg)  Height: 5\' 2"  (1.575 m)    Assessment & Plan:

## 2022-01-08 ENCOUNTER — Telehealth: Payer: Self-pay | Admitting: Internal Medicine

## 2022-01-12 DIAGNOSIS — Z0289 Encounter for other administrative examinations: Secondary | ICD-10-CM

## 2022-01-16 ENCOUNTER — Encounter: Payer: Managed Care, Other (non HMO) | Admitting: Internal Medicine

## 2022-02-05 ENCOUNTER — Encounter (INDEPENDENT_AMBULATORY_CARE_PROVIDER_SITE_OTHER): Payer: Self-pay | Admitting: Bariatrics

## 2022-02-05 ENCOUNTER — Ambulatory Visit (INDEPENDENT_AMBULATORY_CARE_PROVIDER_SITE_OTHER): Payer: Managed Care, Other (non HMO) | Admitting: Bariatrics

## 2022-02-05 VITALS — BP 143/79 | HR 57 | Temp 98.0°F | Ht 62.0 in | Wt 202.0 lb

## 2022-02-05 DIAGNOSIS — E538 Deficiency of other specified B group vitamins: Secondary | ICD-10-CM

## 2022-02-05 DIAGNOSIS — Z0000 Encounter for general adult medical examination without abnormal findings: Secondary | ICD-10-CM

## 2022-02-05 DIAGNOSIS — E559 Vitamin D deficiency, unspecified: Secondary | ICD-10-CM | POA: Diagnosis not present

## 2022-02-05 DIAGNOSIS — R5383 Other fatigue: Secondary | ICD-10-CM

## 2022-02-05 DIAGNOSIS — R0602 Shortness of breath: Secondary | ICD-10-CM | POA: Diagnosis not present

## 2022-02-05 DIAGNOSIS — E669 Obesity, unspecified: Secondary | ICD-10-CM | POA: Diagnosis not present

## 2022-02-05 DIAGNOSIS — R7303 Prediabetes: Secondary | ICD-10-CM

## 2022-02-05 DIAGNOSIS — Z1331 Encounter for screening for depression: Secondary | ICD-10-CM

## 2022-02-05 DIAGNOSIS — Z6837 Body mass index (BMI) 37.0-37.9, adult: Secondary | ICD-10-CM

## 2022-02-05 MED ORDER — VITAMIN D (ERGOCALCIFEROL) 1.25 MG (50000 UNIT) PO CAPS: 50000.0000 [IU] | ORAL_CAPSULE | ORAL | 0 refills | Status: DC

## 2022-02-06 LAB — VITAMIN B12: Vitamin B-12: 685 pg/mL (ref 232–1245)

## 2022-02-06 LAB — INSULIN, RANDOM: INSULIN: 15.3 u[IU]/mL (ref 2.6–24.9)

## 2022-02-17 NOTE — Progress Notes (Signed)
Chief Complaint:   Kayla Barnett (MR# [id number]) is a 61 y.o. female who presents for evaluation and treatment of Kayla and related comorbidities. Current BMI is Body mass index is 36.95 kg/m. Kayla Barnett has been struggling with her weight for many years and has been unsuccessful in either losing weight, maintaining weight loss, or reaching her healthy weight goal.  Kayla Barnett states that this is her heaviest weight.  She struggles with sweets.  Kayla Barnett is currently in the action stage of change and ready to dedicate time achieving and maintaining a healthier weight. Kayla Barnett is interested in becoming our patient and working on intensive lifestyle modifications including (but not limited to) diet and exercise for weight loss.  Kayla Barnett's habits were reviewed today and are as follows: Her family eats meals together, she thinks her family will eat healthier with her, her desired weight loss is 52 lbs, she started gaining weight in 2020 during COVID, her heaviest weight ever was 208 pounds, she is a picky eater and doesn't like to eat healthier foods, she has significant food cravings issues, she snacks frequently in the evenings, she skips meals frequently, she is trying to follow a vegetarian diet, she is frequently drinking liquids with calories, she frequently makes poor food choices, and she frequently eats larger portions than normal.  Depression Screen Kayla Barnett's Food and Mood (modified PHQ-9) score was 8.     02/05/2022    7:05 AM  Depression screen PHQ 2/9  Decreased Interest 1  Down, Depressed, Hopeless 0  PHQ - 2 Score 1  Altered sleeping 1  Tired, decreased energy 3  Change in appetite 1  Feeling bad or failure about yourself  0  Trouble concentrating 1  Moving slowly or fidgety/restless 1  Suicidal thoughts 0  PHQ-9 Score 8  Difficult doing work/chores Not difficult at all   Subjective:   1. Other fatigue Kayla Barnett admits to daytime somnolence and admits to waking up still tired.  Patient has a history of symptoms of daytime fatigue and morning fatigue. Sean generally gets 6 or 8 hours of sleep per night, and states that she has generally restful sleep. Snoring is present. Apneic episodes are not present. Epworth Sleepiness Score is 6.   2. SOB (shortness of breath) on exertion [place] [employer] notes increasing shortness of breath with exercising and seems to be worsening over time with weight gain. She notes getting out of breath sooner with activity than she used to. This has not gotten worse recently. Kayla Barnett denies shortness of breath at rest or orthopnea.  3. Pre-diabetes Kayla Barnett is not on medications currently. Her last A1c was 6.4.  4. Vitamin D deficiency Kayla Barnett is not on Vitamin D currently.   5. Health care maintenance Given Kayla.   6. Vitamin B 12 deficiency Ersilia is not on B12 supplementation.  Assessment/Plan:   1. Other fatigue Debar does feel that her weight is causing her energy to be lower than it should be. Fatigue may be related to Kayla, depression or many other causes. Labs will be ordered, and in the meanwhile, Kayla Barnett will focus on self care including making healthy food choices, increasing physical activity and focusing on stress reduction.  - EKG 12-Lead  2. SOB (shortness of breath) on exertion Kayla Barnett does feel that she gets out of breath more easily that she used to when she exercises. Kayla Barnett's shortness of breath appears to be Kayla related and exercise induced. She has agreed to work on weight loss and gradually  increase exercise to treat her exercise induced shortness of breath. Will continue to monitor closely.  3. Pre-diabetes We will check labs today, and we will follow-up at Avila's next visit.   - Insulin, random  4. Vitamin D deficiency Osie agreed to start prescription Vitamin D 50,000 IU once every 7 days with no refills.   - Vitamin D, Ergocalciferol, (DRISDOL) 1.25 MG (50000 UNIT) CAPS capsule; Take 1 capsule (50,000 Units total) by mouth  every 7 (seven) days.  Dispense: 5 capsule; Refill: 0  5. Health care maintenance We will check labs today. EKG and IC were done today and reviewed with the patient.   6. Vitamin B 12 deficiency We will check labs today, and we will follow-up at Taylar's next visit.  - Vitamin B12  7. Depression screening Kayla Barnett had a positive depression screening. Depression is commonly associated with Kayla and often results in emotional eating behaviors. We will monitor this closely and work on CBT to help improve the non-hunger eating patterns. Referral to Psychology may be required if no improvement is seen as she continues in our clinic.  8. Kayla, Current BMI 37.1 Kayla Barnett is currently in the action stage of change and her goal is to continue with weight loss efforts. I recommend Kayla Barnett begin the structured treatment plan as follows:  She has agreed to the Category 1 Plan.  Meal planning was discussed.  Review labs with the patient from 01/11/2022, CMP, lipids, vitamin D, A1c, 12/31/2021 TSH.  Exercise goals: No exercise has been prescribed at this time.   Behavioral modification strategies: increasing lean protein intake, decreasing simple carbohydrates, increasing vegetables, increasing water intake, decreasing eating out, no skipping meals, meal planning and cooking strategies, keeping healthy foods in the home, and planning for success.  She was informed of the importance of frequent follow-up visits to maximize her success with intensive lifestyle modifications for her multiple health conditions. She was informed we would discuss her lab results at her next visit unless there is a critical issue that needs to be addressed sooner. Kayla Barnett agreed to keep her next visit at the agreed upon time to discuss these results.  Objective:   Blood pressure (!) 143/79, pulse (!) 57, temperature 98 F (36.7 C), height 5\' 2"  (1.575 m), weight 202 lb (91.6 kg), SpO2 96 %. Body mass index is 36.95 kg/m.  EKG: Normal  sinus rhythm, rate 54 BPM.  Indirect Calorimeter completed today shows a VO2 of 185 and a REE of 1267.  Her calculated basal metabolic rate is thus her basal metabolic rate is worse than expected.  General: Cooperative, alert, well developed, in no acute distress. HEENT: Conjunctivae and lids unremarkable. Cardiovascular: Regular rhythm.  Lungs: Normal work of breathing. Neurologic: No focal deficits.   Lab Results  Component Value Date   CREATININE 1.02 01/05/2022   BUN 15 01/05/2022   NA 139 01/05/2022   K 4.3 01/05/2022   CL 102 01/05/2022   CO2 29 01/05/2022   Lab Results  Component Value Date   ALT 15 01/05/2022   AST 23 01/05/2022   ALKPHOS 63 01/05/2022   BILITOT 0.6 01/05/2022   Lab Results  Component Value Date   HGBA1C 6.4 01/05/2022   HGBA1C 6.2 12/31/2020   HGBA1C 5.7 12/28/2014   Lab Results  Component Value Date   INSULIN 15.3 02/05/2022   Lab Results  Component Value Date   TSH 5.44 12/31/2020   Lab Results  Component Value Date   CHOL 224 (H)  01/05/2022   HDL 60.20 01/05/2022   LDLCALC 138 (H) 01/05/2022   TRIG 128.0 01/05/2022   CHOLHDL 4 01/05/2022   Lab Results  Component Value Date   WBC 4.5 01/05/2022   HGB 13.3 01/05/2022   HCT 40.8 01/05/2022   MCV 92.0 01/05/2022   PLT 173.0 01/05/2022   No results found for: "IRON", "TIBC", "FERRITIN"  Attestation Statements:   Reviewed by clinician on day of visit: allergies, medications, problem list, medical history, surgical history, family history, social history, and previous encounter notes.   Trude Mcburney, am acting as Energy manager for [employer], DO.  I have reviewed the above documentation for accuracy and completeness, and I agree with the above. Corinna Capra, DO

## 2022-02-19 ENCOUNTER — Encounter (INDEPENDENT_AMBULATORY_CARE_PROVIDER_SITE_OTHER): Payer: Self-pay | Admitting: Bariatrics

## 2022-02-19 ENCOUNTER — Ambulatory Visit (INDEPENDENT_AMBULATORY_CARE_PROVIDER_SITE_OTHER): Payer: Managed Care, Other (non HMO) | Admitting: Bariatrics

## 2022-02-19 VITALS — BP 141/80 | HR 58 | Temp 98.1°F | Ht 62.0 in | Wt 205.0 lb

## 2022-02-19 DIAGNOSIS — R7303 Prediabetes: Secondary | ICD-10-CM

## 2022-02-19 DIAGNOSIS — R632 Polyphagia: Secondary | ICD-10-CM | POA: Diagnosis not present

## 2022-02-19 DIAGNOSIS — E669 Obesity, unspecified: Secondary | ICD-10-CM

## 2022-02-19 DIAGNOSIS — Z6837 Body mass index (BMI) 37.0-37.9, adult: Secondary | ICD-10-CM

## 2022-02-19 DIAGNOSIS — E559 Vitamin D deficiency, unspecified: Secondary | ICD-10-CM | POA: Insufficient documentation

## 2022-02-19 DIAGNOSIS — E538 Deficiency of other specified B group vitamins: Secondary | ICD-10-CM

## 2022-02-19 MED ORDER — VITAMIN D (ERGOCALCIFEROL) 1.25 MG (50000 UNIT) PO CAPS: 50000.0000 [IU] | ORAL_CAPSULE | ORAL | 0 refills | Status: DC

## 2022-02-19 MED ORDER — WEGOVY 0.25 MG/0.5ML ~~LOC~~ SOAJ: 0.2500 mg | SUBCUTANEOUS | 0 refills | Status: DC

## 2022-02-24 ENCOUNTER — Encounter (INDEPENDENT_AMBULATORY_CARE_PROVIDER_SITE_OTHER): Payer: Self-pay | Admitting: Bariatrics

## 2022-02-24 NOTE — Progress Notes (Signed)
Chief Complaint:   OBESITY Kayla Barnett is here to discuss her progress with her obesity treatment plan along with follow-up of her obesity related diagnoses. Kayla Barnett is on the Category 1 Plan and states she is following her eating plan approximately 75% of the time. Kayla Barnett states she is walking for 45 to 60 minutes 3 times per week.  Today's visit was #: 2 Starting weight: 202 lbs Starting date: 02/05/22 Today's weight: 205 lbs Today's date: 02/19/22 Total lbs lost to date: 0 Total lbs lost since last in-office visit: +3  Interim History: She has been doing well on her plan.  She is drinking adequate water.  Subjective:   1. Pre-diabetes A1c is 6.4, insulin is 15.3.  2. Polyphagia No contraindications to GLP-1.  3. Vitamin B 12 deficiency Stable.  Last B12 level was 685.  4. Vitamin D deficiency Vitamin D level 32.31.  Assessment/Plan:   1. Pre-diabetes Handout-insulin resistance and prediabetes.  2. Polyphagia 1.  Discussed GLP-1's risks, benefits. 2.  Prescription - Semaglutide-Weight Management (WEGOVY) 0.25 MG/0.5ML SOAJ; Inject 0.25 mg into the skin once a week.  Dispense: 2 mL; Refill: 0  3. Vitamin B 12 deficiency Continue B12 rich meal plan.  4. Vitamin D deficiency Prescription - Vitamin D, Ergocalciferol, (DRISDOL) 1.25 MG (50000 UNIT) CAPS capsule; Take 1 capsule (50,000 Units total) by mouth every 7 (seven) days.  Dispense: 5 capsule; Refill: 0  5. Obesity, current BMI 37.6 1.  Will follow the plan at least 80 to 90%. 2.  Intentional eating 3.  Reviewed labs 02/05/2022-B12, insulin and A1c from 01/05/2022. - Semaglutide-Weight Management (WEGOVY) 0.25 MG/0.5ML SOAJ; Inject 0.25 mg into the skin once a week.  Dispense: 2 mL; Refill: 0  [name] is currently in the action stage of change. As such, her goal is to continue with weight loss efforts. She has agreed to the Category 1 Plan.   Exercise goals: Walking.  Behavioral modification strategies: increasing lean  protein intake, decreasing simple carbohydrates, increasing vegetables, increasing water intake, decreasing eating out, no skipping meals, meal planning and cooking strategies, keeping healthy foods in the home, and planning for success.  Kayla Barnett has agreed to follow-up with our clinic in 2-3 weeks. She was informed of the importance of frequent follow-up visits to maximize her success with intensive lifestyle modifications for her multiple health conditions.    Objective:   Blood pressure (!) 141/80, pulse (!) 58, temperature 98.1 F (36.7 C), height 5\' 2"  (1.575 m), weight 205 lb (93 kg), SpO2 97 %. Body mass index is 37.49 kg/m.  General: Cooperative, alert, well developed, in no acute distress. HEENT: Conjunctivae and lids unremarkable. Cardiovascular: Regular rhythm.  Lungs: Normal work of breathing. Neurologic: No focal deficits.   Lab Results  Component Value Date   CREATININE 1.02 01/05/2022   BUN 15 01/05/2022   NA 139 01/05/2022   K 4.3 01/05/2022   CL 102 01/05/2022   CO2 29 01/05/2022   Lab Results  Component Value Date   ALT 15 01/05/2022   AST 23 01/05/2022   ALKPHOS 63 01/05/2022   BILITOT 0.6 01/05/2022   Lab Results  Component Value Date   HGBA1C 6.4 01/05/2022   HGBA1C 6.2 12/31/2020   HGBA1C 5.7 12/28/2014   Lab Results  Component Value Date   INSULIN 15.3 02/05/2022   Lab Results  Component Value Date   TSH 5.44 12/31/2020   Lab Results  Component Value Date   CHOL 224 (H) 01/05/2022  HDL 60.20 01/05/2022   LDLCALC 138 (H) 01/05/2022   TRIG 128.0 01/05/2022   CHOLHDL 4 01/05/2022   Lab Results  Component Value Date   VD25OH 32.31 01/05/2022   VD25OH 36.79 12/31/2020   VD25OH 59.88 04/16/2017   Lab Results  Component Value Date   WBC 4.5 01/05/2022   HGB 13.3 01/05/2022   HCT 40.8 01/05/2022   MCV 92.0 01/05/2022   PLT 173.0 01/05/2022   No results found for: "IRON", "TIBC", "FERRITIN"   Attestation Statements:   Reviewed by  clinician on day of visit: allergies, medications, problem list, medical history, surgical history, family history, social history, and previous encounter notes.  I, Dawn Whitmire, FNP-C, am acting as transcriptionist for Dr. Corinna Capra.  I have reviewed the above documentation for accuracy and completeness, and I agree with the above. Corinna Capra, DO

## 2022-02-25 ENCOUNTER — Encounter (INDEPENDENT_AMBULATORY_CARE_PROVIDER_SITE_OTHER): Payer: Self-pay | Admitting: Bariatrics

## 2022-02-25 ENCOUNTER — Ambulatory Visit (INDEPENDENT_AMBULATORY_CARE_PROVIDER_SITE_OTHER): Payer: Managed Care, Other (non HMO) | Admitting: Bariatrics

## 2022-02-25 VITALS — BP 165/88 | HR 61 | Temp 98.2°F | Ht 62.0 in | Wt 204.0 lb

## 2022-02-25 DIAGNOSIS — Z6837 Body mass index (BMI) 37.0-37.9, adult: Secondary | ICD-10-CM

## 2022-02-25 DIAGNOSIS — E559 Vitamin D deficiency, unspecified: Secondary | ICD-10-CM

## 2022-02-25 DIAGNOSIS — E669 Obesity, unspecified: Secondary | ICD-10-CM

## 2022-02-25 DIAGNOSIS — R7303 Prediabetes: Secondary | ICD-10-CM | POA: Diagnosis not present

## 2022-02-25 MED ORDER — VITAMIN D (ERGOCALCIFEROL) 1.25 MG (50000 UNIT) PO CAPS: 50000.0000 [IU] | ORAL_CAPSULE | ORAL | 0 refills | Status: DC

## 2022-02-25 MED ORDER — WEGOVY 0.25 MG/0.5ML ~~LOC~~ SOAJ: 0.2500 mg | SUBCUTANEOUS | 0 refills | Status: DC

## 2022-02-26 ENCOUNTER — Telehealth (INDEPENDENT_AMBULATORY_CARE_PROVIDER_SITE_OTHER): Payer: Self-pay | Admitting: Bariatrics

## 2022-02-26 ENCOUNTER — Encounter (INDEPENDENT_AMBULATORY_CARE_PROVIDER_SITE_OTHER): Payer: Self-pay

## 2022-02-26 NOTE — Telephone Encounter (Signed)
Dr. Manson Passey - Prior authorization is not required for [facility] per insurance. Patient sent message via mychart.

## 2022-03-05 ENCOUNTER — Ambulatory Visit (INDEPENDENT_AMBULATORY_CARE_PROVIDER_SITE_OTHER): Payer: Managed Care, Other (non HMO) | Admitting: Bariatrics

## 2022-03-09 NOTE — Progress Notes (Unsigned)
Chief Complaint:   OBESITY Dezarae is here to discuss her progress with her obesity treatment plan along with follow-up of her obesity related diagnoses. Honi is on the Category 1 Plan and states she is following her eating plan approximately 80% of the time. Fani states she is walking for 45-60 minutes 3 times per week.  Today's visit was #: 3 Starting weight: 202 lbs Starting date: 02/05/2022 Today's weight: 204 lbs Today's date: 02/25/2022 Total lbs lost to date: 0 Total lbs lost since last in-office visit: 1  Interim History: Takera is down about 1 pound.  She has increased her water intake.  She struggles with sweets late at night.  Subjective:   1. Pre-diabetes Lezli struggles with sweets. Her A1c was 6.4 and insulin 15.3.  2. Vitamin D deficiency Kinslea is taking Vitamin D.   Assessment/Plan:   1. Pre-diabetes Handouts on insulin resistance and prediabetes were given to the patient today. Jnya will continue Wegovy at 0.25 mg once weekly.  2. Vitamin D deficiency Naveen will continue prescription vitamin D 50,000 units once weekly, and we will refill for 1 month.  - Vitamin D, Ergocalciferol, (DRISDOL) 1.25 MG (50000 UNIT) CAPS capsule; Take 1 capsule (50,000 Units total) by mouth every 7 (seven) days.  Dispense: 5 capsule; Refill: 0  3. Obesity, current BMI 37.4 Jesseca is currently in the action stage of change. As such, her goal is to continue with weight loss efforts. She has agreed to the Category 1 Plan.   Meal planning was discussed.  Reviewed labs with the patient from 02/05/2022, B12, insulin 15.3, 01/05/2022 A1c.  We discussed various medication options to help Abeera with her weight loss efforts and we both agreed to continue Wegovy 0.25 mg once weekly, and we will refill for 1 month.  - Semaglutide-Weight Management (WEGOVY) 0.25 MG/0.5ML SOAJ; Inject 0.25 mg into the skin once a week.  Dispense: 2 mL; Refill: 0  Exercise goals: As is.   Behavioral modification  strategies: increasing lean protein intake, decreasing simple carbohydrates, increasing vegetables, increasing water intake, no skipping meals, meal planning and cooking strategies, keeping healthy foods in the home, celebration eating strategies, avoiding temptations, planning for success, keeping a strict food journal, and decreasing junk food.  Daejah has agreed to follow-up with our clinic in 3 weeks. She was informed of the importance of frequent follow-up visits to maximize her success with intensive lifestyle modifications for her multiple health conditions.   Objective:   Blood pressure (!) 165/88, pulse 61, temperature 98.2 F (36.8 C), height 5\' 2"  (1.575 m), weight 204 lb (92.5 kg), SpO2 96 %. Body mass index is 37.31 kg/m.  General: Cooperative, alert, well developed, in no acute distress. HEENT: Conjunctivae and lids unremarkable. Cardiovascular: Regular rhythm.  Lungs: Normal work of breathing. Neurologic: No focal deficits.   Lab Results  Component Value Date   CREATININE 1.02 01/05/2022   BUN 15 01/05/2022   NA 139 01/05/2022   K 4.3 01/05/2022   CL 102 01/05/2022   CO2 29 01/05/2022   Lab Results  Component Value Date   ALT 15 01/05/2022   AST 23 01/05/2022   ALKPHOS 63 01/05/2022   BILITOT 0.6 01/05/2022   Lab Results  Component Value Date   HGBA1C 6.4 01/05/2022   HGBA1C 6.2 12/31/2020   HGBA1C 5.7 12/28/2014   Lab Results  Component Value Date   INSULIN 15.3 02/05/2022   Lab Results  Component Value Date   TSH 5.44 12/31/2020  Lab Results  Component Value Date   CHOL 224 (H) 01/05/2022   HDL 60.20 01/05/2022   LDLCALC 138 (H) 01/05/2022   TRIG 128.0 01/05/2022   CHOLHDL 4 01/05/2022   Lab Results  Component Value Date   VD25OH 32.31 01/05/2022   VD25OH 36.79 12/31/2020   VD25OH 59.88 04/16/2017   Lab Results  Component Value Date   WBC 4.5 01/05/2022   HGB 13.3 01/05/2022   HCT 40.8 01/05/2022   MCV 92.0 01/05/2022   PLT 173.0  01/05/2022   No results found for: "IRON", "TIBC", "FERRITIN"  Attestation Statements:   Reviewed by clinician on day of visit: allergies, medications, problem list, medical history, surgical history, family history, social history, and previous encounter notes.   Trude Mcburney, am acting as Energy manager for [employer], DO.  I have reviewed the above documentation for accuracy and completeness, and I agree with the above. Corinna Capra, DO

## 2022-03-11 ENCOUNTER — Encounter (INDEPENDENT_AMBULATORY_CARE_PROVIDER_SITE_OTHER): Payer: Self-pay | Admitting: Bariatrics

## 2022-03-16 ENCOUNTER — Telehealth: Payer: Self-pay

## 2022-03-16 NOTE — Telephone Encounter (Signed)
Left message pertaining to scheduling a mammogram. 

## 2022-03-18 ENCOUNTER — Ambulatory Visit (INDEPENDENT_AMBULATORY_CARE_PROVIDER_SITE_OTHER): Payer: Managed Care, Other (non HMO) | Admitting: Bariatrics

## 2022-03-18 ENCOUNTER — Encounter: Payer: Self-pay | Admitting: Bariatrics

## 2022-03-18 VITALS — BP 160/74 | HR 67 | Temp 97.7°F | Ht 62.0 in | Wt 205.0 lb

## 2022-03-18 DIAGNOSIS — F509 Eating disorder, unspecified: Secondary | ICD-10-CM | POA: Insufficient documentation

## 2022-03-18 DIAGNOSIS — E669 Obesity, unspecified: Secondary | ICD-10-CM | POA: Diagnosis not present

## 2022-03-18 DIAGNOSIS — E559 Vitamin D deficiency, unspecified: Secondary | ICD-10-CM | POA: Diagnosis not present

## 2022-03-18 DIAGNOSIS — Z6837 Body mass index (BMI) 37.0-37.9, adult: Secondary | ICD-10-CM

## 2022-03-18 DIAGNOSIS — R7303 Prediabetes: Secondary | ICD-10-CM

## 2022-03-18 DIAGNOSIS — F5089 Other specified eating disorder: Secondary | ICD-10-CM | POA: Diagnosis not present

## 2022-03-18 MED ORDER — BUPROPION HCL ER (SR) 150 MG PO TB12: 150.0000 mg | ORAL_TABLET | Freq: Every day | ORAL | 0 refills | Status: DC

## 2022-03-18 MED ORDER — VITAMIN D (ERGOCALCIFEROL) 1.25 MG (50000 UNIT) PO CAPS: 50000.0000 [IU] | ORAL_CAPSULE | ORAL | 0 refills | Status: DC

## 2022-03-18 MED ORDER — WEGOVY 0.25 MG/0.5ML ~~LOC~~ SOAJ: 0.2500 mg | SUBCUTANEOUS | 0 refills | Status: DC

## 2022-03-19 NOTE — Progress Notes (Signed)
Chief Complaint:   OBESITY Kayla Barnett is here to discuss her progress with her obesity treatment plan along with follow-up of her obesity related diagnoses. Kayla Barnett is on the Category 1 Plan and states she is following her eating plan approximately 85% of the time. Kayla Barnett states she is walking for 45 to 60 minutes 4 times per week.  Today's visit was #: 4 Starting weight: 202 lbs Starting date: 02/05/2022 Today's weight: 205 lbs Today's date: 03/18/22 Total lbs lost to date: 0 Total lbs lost since last in-office visit: +1  Interim History: She is up 1 pound since her last visit.  She has increased her water and protein.  Subjective:   1. Vitamin D deficiency Taking vitamin D.  2. Pre-diabetes Prescribed Wegovy, but unable to get prescription.  3. Other disorder of eating Increased cravings.  No history of seizures.  Assessment/Plan:   1. Vitamin D deficiency Refill: - Vitamin D, Ergocalciferol, (DRISDOL) 1.25 MG (50000 UNIT) CAPS capsule; Take 1 capsule (50,000 Units total) by mouth every 7 (seven) days.  Dispense: 5 capsule; Refill: 0  2. Pre-diabetes 1. Decrease carbs, increase protein and fiber. 2.  Call insurance to see if they cover any antiobesity medications.  3. Other disorder of eating Prescription: - buPROPion (WELLBUTRIN SR) 150 MG 12 hr tablet; Take 1 tablet (150 mg total) by mouth daily.  Dispense: 30 tablet; Refill: 0  4. Obesity, current BMI 37.5 1.  Meal planning 2.  Intentional eating 3. Refill: - Semaglutide-Weight Management (WEGOVY) 0.25 MG/0.5ML SOAJ; Inject 0.25 mg into the skin once a week.  Dispense: 2 mL; Refill: 0  Kayla Barnett is currently in the action stage of change. As such, her goal is to continue with weight loss efforts. She has agreed to the Category 1 Plan.   Exercise goals: as is.  Behavioral modification strategies: increasing lean protein intake, decreasing simple carbohydrates, increasing vegetables, increasing water intake, no skipping  meals, meal planning and cooking strategies, keeping healthy foods in the home, ways to avoid boredom eating, and planning for success.  Kayla Barnett has agreed to follow-up with our clinic in 2 weeks. She was informed of the importance of frequent follow-up visits to maximize her success with intensive lifestyle modifications for her multiple health conditions.   Objective:   Blood pressure (!) 160/74, pulse 67, temperature 97.7 F (36.5 C), height 5\' 2"  (1.575 m), weight 205 lb (93 kg), SpO2 98 %. Body mass index is 37.49 kg/m.  General: Cooperative, alert, well developed, in no acute distress. HEENT: Conjunctivae and lids unremarkable. Cardiovascular: Regular rhythm.  Lungs: Normal work of breathing. Neurologic: No focal deficits.   Lab Results  Component Value Date   CREATININE 1.02 01/05/2022   BUN 15 01/05/2022   NA 139 01/05/2022   K 4.3 01/05/2022   CL 102 01/05/2022   CO2 29 01/05/2022   Lab Results  Component Value Date   ALT 15 01/05/2022   AST 23 01/05/2022   ALKPHOS 63 01/05/2022   BILITOT 0.6 01/05/2022   Lab Results  Component Value Date   HGBA1C 6.4 01/05/2022   HGBA1C 6.2 12/31/2020   HGBA1C 5.7 12/28/2014   Lab Results  Component Value Date   INSULIN 15.3 02/05/2022   Lab Results  Component Value Date   TSH 5.44 12/31/2020   Lab Results  Component Value Date   CHOL 224 (H) 01/05/2022   HDL 60.20 01/05/2022   LDLCALC 138 (H) 01/05/2022   TRIG 128.0 01/05/2022   CHOLHDL  4 01/05/2022   Lab Results  Component Value Date   VD25OH 32.31 01/05/2022   VD25OH 36.79 12/31/2020   VD25OH 59.88 04/16/2017   Lab Results  Component Value Date   WBC 4.5 01/05/2022   HGB 13.3 01/05/2022   HCT 40.8 01/05/2022   MCV 92.0 01/05/2022   PLT 173.0 01/05/2022   No results found for: "IRON", "TIBC", "FERRITIN"  Attestation Statements:   Reviewed by clinician on day of visit: allergies, medications, problem list, medical history, surgical history, family  history, social history, and previous encounter notes.  I, Dawn Whitmire, FNP-C, am acting as transcriptionist for Dr. Corinna Capra.  I have reviewed the above documentation for accuracy and completeness, and I agree with the above. Corinna Capra, DO

## 2022-03-23 ENCOUNTER — Ambulatory Visit: Payer: Self-pay | Admitting: Bariatrics

## 2022-04-01 ENCOUNTER — Ambulatory Visit (INDEPENDENT_AMBULATORY_CARE_PROVIDER_SITE_OTHER): Payer: Managed Care, Other (non HMO) | Admitting: Bariatrics

## 2022-04-01 ENCOUNTER — Encounter: Payer: Self-pay | Admitting: Bariatrics

## 2022-04-01 VITALS — BP 160/73 | HR 72 | Temp 98.4°F | Ht 62.0 in | Wt 206.0 lb

## 2022-04-01 DIAGNOSIS — F5089 Other specified eating disorder: Secondary | ICD-10-CM | POA: Diagnosis not present

## 2022-04-01 DIAGNOSIS — R030 Elevated blood-pressure reading, without diagnosis of hypertension: Secondary | ICD-10-CM | POA: Insufficient documentation

## 2022-04-01 DIAGNOSIS — R632 Polyphagia: Secondary | ICD-10-CM

## 2022-04-01 DIAGNOSIS — Z6837 Body mass index (BMI) 37.0-37.9, adult: Secondary | ICD-10-CM

## 2022-04-01 DIAGNOSIS — E559 Vitamin D deficiency, unspecified: Secondary | ICD-10-CM

## 2022-04-01 DIAGNOSIS — E669 Obesity, unspecified: Secondary | ICD-10-CM

## 2022-04-01 MED ORDER — VITAMIN D (ERGOCALCIFEROL) 1.25 MG (50000 UNIT) PO CAPS: 50000.0000 [IU] | ORAL_CAPSULE | ORAL | 0 refills | Status: DC

## 2022-04-01 MED ORDER — CHLORTHALIDONE 25 MG PO TABS: 25.0000 mg | ORAL_TABLET | Freq: Every day | ORAL | 0 refills | Status: DC

## 2022-04-01 MED ORDER — QSYMIA 7.5-46 MG PO CP24: ORAL_CAPSULE | ORAL | 0 refills | Status: DC

## 2022-04-07 ENCOUNTER — Encounter: Payer: Self-pay | Admitting: Bariatrics

## 2022-04-07 NOTE — Progress Notes (Signed)
Chief Complaint:   OBESITY Kayla Barnett is here to discuss her progress with her obesity treatment plan along with follow-up of her obesity related diagnoses. Kayla Barnett is on the Category 1 Plan and states she is following her eating plan approximately 80-85% of the time. Kayla Barnett states she is walking for 60 minutes 3 times per week.  Today's visit was #: 5 Starting weight: 202 lbs Starting date: 02/05/2022 Today's weight: 206 lbs Today's date: 04/01/2022 Total lbs lost to date: 0 Total lbs lost since last in-office visit: 0  Interim History: Pa is up 1 pound since her last visit.  She is up about 3 pounds in water weight per the bioimpedance scale since her last visit.  Subjective:   1. Vitamin D deficiency Kayla Barnett is taking prescription vitamin D as directed.  2. Elevated blood pressure reading Kayla Barnett denies increase in salt intake. She is not on medications currently. Her blood pressure has been systolic 140-160's/diastolic 70-80's at several visits.   3. Polyphagia Kayla Barnett notes her hunger is high at night. She has tried Qsymia without side effects and she wants to try it again.   4. Other disorder of eating Kayla Barnett is taking Wellbutrin, and she notes lightheadedness. She took Qsymia in the past.   Assessment/Plan:   1. Vitamin D deficiency Kayla Barnett will continue prescription vitamin D 50,000 units once weekly, and we will refill for 1 month.  - Vitamin D, Ergocalciferol, (DRISDOL) 1.25 MG (50000 UNIT) CAPS capsule; Take 1 capsule (50,000 Units total) by mouth every 7 (seven) days.  Dispense: 5 capsule; Refill: 0  2. Elevated blood pressure reading Kayla Barnett agreed to start chlorthalidone 25 mg once daily with no refills. We will follow-up on her blood pressure kat her next visit.   - chlorthalidone (HYGROTON) 25 MG tablet; Take 1 tablet (25 mg total) by mouth daily.  Dispense: 30 tablet; Refill: 0  3. Polyphagia [name] agreed to start Qsymia 7.5-46 mg once daily with no refills.   -  Phentermine-Topiramate (QSYMIA) 7.5-46 MG CP24; 1 capsule daily  Dispense: 30 capsule; Refill: 0  4. Other disorder of eating [name] agreed to discontinue Wellbutrin due to side effects.   5. Obesity, current BMI 37.8 Kayla Barnett is currently in the action stage of change. As such, her goal is to continue with weight loss efforts. She has agreed to the Category 1 Plan and keeping a food journal and adhering to recommended goals of 1000 calories and 70-80 grams of protein daily.   She will continue to adhere to the plan 80-85%.  Exercise goals: As is.   Behavioral modification strategies: increasing lean protein intake, decreasing simple carbohydrates, increasing vegetables, increasing water intake, decreasing eating out, no skipping meals, meal planning and cooking strategies, keeping healthy foods in the home, and planning for success.  Kayla Barnett has agreed to follow-up with our clinic in 2 to 3 weeks. She was informed of the importance of frequent follow-up visits to maximize her success with intensive lifestyle modifications for her multiple health conditions.   Objective:   Blood pressure (!) 160/73, pulse 72, temperature 98.4 F (36.9 C), height 5\' 2"  (1.575 m), weight 206 lb (93.4 kg), SpO2 99 %. Body mass index is 37.68 kg/m.  General: Cooperative, alert, well developed, in no acute distress. HEENT: Conjunctivae and lids unremarkable. Cardiovascular: Regular rhythm.  Lungs: Normal work of breathing. Neurologic: No focal deficits.   Lab Results  Component Value Date   CREATININE 1.02 01/05/2022   BUN 15 01/05/2022  NA 139 01/05/2022   K 4.3 01/05/2022   CL 102 01/05/2022   CO2 29 01/05/2022   Lab Results  Component Value Date   ALT 15 01/05/2022   AST 23 01/05/2022   ALKPHOS 63 01/05/2022   BILITOT 0.6 01/05/2022   Lab Results  Component Value Date   HGBA1C 6.4 01/05/2022   HGBA1C 6.2 12/31/2020   HGBA1C 5.7 12/28/2014   Lab Results  Component Value Date   INSULIN 15.3  02/05/2022   Lab Results  Component Value Date   TSH 5.44 12/31/2020   Lab Results  Component Value Date   CHOL 224 (H) 01/05/2022   HDL 60.20 01/05/2022   LDLCALC 138 (H) 01/05/2022   TRIG 128.0 01/05/2022   CHOLHDL 4 01/05/2022   Lab Results  Component Value Date   VD25OH 32.31 01/05/2022   VD25OH 36.79 12/31/2020   VD25OH 59.88 04/16/2017   Lab Results  Component Value Date   WBC 4.5 01/05/2022   HGB 13.3 01/05/2022   HCT 40.8 01/05/2022   MCV 92.0 01/05/2022   PLT 173.0 01/05/2022   No results found for: "IRON", "TIBC", "FERRITIN"  Attestation Statements:   Reviewed by clinician on day of visit: allergies, medications, problem list, medical history, surgical history, family history, social history, and previous encounter notes.   Wilhemena Durie, am acting as Location manager for [employer], DO.  I have reviewed the above documentation for accuracy and completeness, and I agree with the above. Jearld Lesch, DO

## 2022-05-05 ENCOUNTER — Encounter: Payer: Self-pay | Admitting: Bariatrics

## 2022-05-05 ENCOUNTER — Ambulatory Visit (INDEPENDENT_AMBULATORY_CARE_PROVIDER_SITE_OTHER): Payer: Managed Care, Other (non HMO) | Admitting: Bariatrics

## 2022-05-05 VITALS — BP 146/82 | HR 62 | Temp 98.0°F | Ht 62.0 in | Wt 204.0 lb

## 2022-05-05 DIAGNOSIS — E669 Obesity, unspecified: Secondary | ICD-10-CM | POA: Diagnosis not present

## 2022-05-05 DIAGNOSIS — I10 Essential (primary) hypertension: Secondary | ICD-10-CM | POA: Diagnosis not present

## 2022-05-05 DIAGNOSIS — R632 Polyphagia: Secondary | ICD-10-CM | POA: Diagnosis not present

## 2022-05-05 DIAGNOSIS — E559 Vitamin D deficiency, unspecified: Secondary | ICD-10-CM

## 2022-05-05 DIAGNOSIS — Z6837 Body mass index (BMI) 37.0-37.9, adult: Secondary | ICD-10-CM

## 2022-05-05 MED ORDER — QSYMIA 7.5-46 MG PO CP24: ORAL_CAPSULE | ORAL | 0 refills | Status: DC

## 2022-05-05 MED ORDER — CHLORTHALIDONE 25 MG PO TABS: 25.0000 mg | ORAL_TABLET | Freq: Every day | ORAL | 0 refills | Status: DC

## 2022-05-05 MED ORDER — VITAMIN D (ERGOCALCIFEROL) 1.25 MG (50000 UNIT) PO CAPS: 50000.0000 [IU] | ORAL_CAPSULE | ORAL | 0 refills | Status: DC

## 2022-05-18 NOTE — Progress Notes (Signed)
Chief Complaint:   OBESITY Kayla Barnett is here to discuss her progress with her obesity treatment plan along with follow-up of her obesity related diagnoses. Kayla Barnett is on the Category 1 Plan and keeping a food journal and adhering to recommended goals of 1000 calories and 70-80 grams of protein daily and states she is following her eating plan approximately 75% of the time. Kayla Barnett states she is walking for 30 minutes 3 times per week.  Today's visit was #: 6 Starting weight: 202 lbs Starting date: 02/05/2022 Today's weight: 204 lbs Today's date: 05/05/2022 Total lbs lost to date: 0 Total lbs lost since last in-office visit: 2  Interim History: Kayla Barnett is down 2 pounds since her last visit.  Her cravings and appetite are down, and she is trying to eat healthier meals.  Subjective:   1. Vitamin D deficiency Kayla Barnett is taking vitamin D as directed.  2. Essential hypertension Kayla Barnett is taking her blood pressure medications.  3. Polyphagia Kayla Barnett is taking Qsymia with some benefits of decreased appetite.  Assessment/Plan:   1. Vitamin D deficiency We will refill prescription Vitamin D 50,000 IU every week for 90 days. Kayla Barnett will follow-up for routine testing of Vitamin D, at least 2-3 times per year to avoid over-replacement.  - Vitamin D, Ergocalciferol, (DRISDOL) 1.25 MG (50000 UNIT) CAPS capsule; Take 1 capsule (50,000 Units total) by mouth every 7 (seven) days.  Dispense: 12 capsule; Refill: 0  2. Essential hypertension Kayla Barnett will continue chlorthalidone 25 mg once daily, and we will refill for 90 days.   - chlorthalidone (HYGROTON) 25 MG tablet; Take 1 tablet (25 mg total) by mouth daily.  Dispense: 90 tablet; Refill: 0  3. Polyphagia Kayla Barnett will continue Qsymia, and we will refill for 1 month. PDMP was checked.   - Phentermine-Topiramate (QSYMIA) 7.5-46 MG CP24; 1 capsule daily  Dispense: 30 capsule; Refill: 0  4. Obesity, current BMI 37.4 Kayla Barnett is currently in the action stage of change. As  such, her goal is to continue with weight loss efforts. She has agreed to the Category 1 Plan.   Meal planning and intentional eating were discussed.   Exercise goals: As is.   Behavioral modification strategies: increasing lean protein intake, decreasing simple carbohydrates, increasing vegetables, increasing water intake, decreasing eating out, no skipping meals, meal planning and cooking strategies, keeping healthy foods in the home, and planning for success.  Kayla Barnett has agreed to follow-up with our clinic in 4 weeks. She was informed of the importance of frequent follow-up visits to maximize her success with intensive lifestyle modifications for her multiple health conditions.   Objective:   Blood pressure (!) 146/82, pulse 62, temperature 98 F (36.7 C), height 5\' 2"  (1.575 m), weight 204 lb (92.5 kg), SpO2 96 %. Body mass index is 37.31 kg/m.  General: Cooperative, alert, well developed, in no acute distress. HEENT: Conjunctivae and lids unremarkable. Cardiovascular: Regular rhythm.  Lungs: Normal work of breathing. Neurologic: No focal deficits.   Lab Results  Component Value Date   CREATININE 1.02 01/05/2022   BUN 15 01/05/2022   NA 139 01/05/2022   K 4.3 01/05/2022   CL 102 01/05/2022   CO2 29 01/05/2022   Lab Results  Component Value Date   ALT 15 01/05/2022   AST 23 01/05/2022   ALKPHOS 63 01/05/2022   BILITOT 0.6 01/05/2022   Lab Results  Component Value Date   HGBA1C 6.4 01/05/2022   HGBA1C 6.2 12/31/2020   HGBA1C 5.7 12/28/2014  Lab Results  Component Value Date   INSULIN 15.3 02/05/2022   Lab Results  Component Value Date   TSH 5.44 12/31/2020   Lab Results  Component Value Date   CHOL 224 (H) 01/05/2022   HDL 60.20 01/05/2022   LDLCALC 138 (H) 01/05/2022   TRIG 128.0 01/05/2022   CHOLHDL 4 01/05/2022   Lab Results  Component Value Date   VD25OH 32.31 01/05/2022   VD25OH 36.79 12/31/2020   VD25OH 59.88 04/16/2017   Lab Results   Component Value Date   WBC 4.5 01/05/2022   HGB 13.3 01/05/2022   HCT 40.8 01/05/2022   MCV 92.0 01/05/2022   PLT 173.0 01/05/2022   No results found for: "IRON", "TIBC", "FERRITIN"  Attestation Statements:   Reviewed by clinician on day of visit: allergies, medications, problem list, medical history, surgical history, family history, social history, and previous encounter notes.   Trude Mcburney, am acting as transcriptionist for [employer], DO  I have reviewed the above documentation for accuracy and completeness, and I agree with the above. Corinna Capra, DO

## 2022-06-09 ENCOUNTER — Encounter: Payer: Self-pay | Admitting: Bariatrics

## 2022-06-10 ENCOUNTER — Ambulatory Visit: Payer: Managed Care, Other (non HMO) | Admitting: Bariatrics

## 2022-07-27 ENCOUNTER — Other Ambulatory Visit: Payer: Self-pay | Admitting: Bariatrics

## 2022-07-27 DIAGNOSIS — E559 Vitamin D deficiency, unspecified: Secondary | ICD-10-CM

## 2022-07-27 DIAGNOSIS — I10 Essential (primary) hypertension: Secondary | ICD-10-CM

## 2023-01-15 ENCOUNTER — Ambulatory Visit (INDEPENDENT_AMBULATORY_CARE_PROVIDER_SITE_OTHER): Payer: Managed Care, Other (non HMO) | Admitting: Internal Medicine

## 2023-01-15 ENCOUNTER — Encounter: Payer: Self-pay | Admitting: Internal Medicine

## 2023-01-15 VITALS — BP 120/60 | HR 60 | Temp 98.6°F | Ht 62.0 in | Wt 202.0 lb

## 2023-01-15 DIAGNOSIS — Z0000 Encounter for general adult medical examination without abnormal findings: Secondary | ICD-10-CM

## 2023-01-15 DIAGNOSIS — I10 Essential (primary) hypertension: Secondary | ICD-10-CM

## 2023-01-15 DIAGNOSIS — R7303 Prediabetes: Secondary | ICD-10-CM | POA: Diagnosis not present

## 2023-01-15 DIAGNOSIS — E559 Vitamin D deficiency, unspecified: Secondary | ICD-10-CM

## 2023-01-15 DIAGNOSIS — A530 Latent syphilis, unspecified as early or late: Secondary | ICD-10-CM | POA: Insufficient documentation

## 2023-01-15 NOTE — Assessment & Plan Note (Signed)
Checking HgA1c and adjust as needed.  

## 2023-01-15 NOTE — Assessment & Plan Note (Signed)
BMI 36 and complicated by hypertension and pre-diabetes. She is working on diet and exercise to help.

## 2023-01-15 NOTE — Assessment & Plan Note (Signed)
She is confused about recent positive rpr test with gyn. She states she got confirmative testing which was positive but we do not have those results. She would like repeat testing which we have ordered with rpr and treponemal testing. No new partner.

## 2023-01-15 NOTE — Progress Notes (Signed)
   Subjective:   Patient ID: Kayla Barnett, female    DOB: 1960/09/23, 62 y.o.   MRN: [id number]  HPI The patient is here for physical. Also with acute concerns about some labs at gyn visit.   PMH, [facility], social history reviewed and updated  Review of Systems  Constitutional: Negative.   HENT: Negative.    Eyes: Negative.   Respiratory:  Negative for cough, chest tightness and shortness of breath.   Cardiovascular:  Negative for chest pain, palpitations and leg swelling.  Gastrointestinal:  Negative for abdominal distention, abdominal pain, constipation, diarrhea, nausea and vomiting.  Musculoskeletal: Negative.   Skin: Negative.   Neurological: Negative.   Psychiatric/Behavioral: Negative.      Objective:  Physical Exam Constitutional:      Appearance: She is well-developed.  HENT:     Head: Normocephalic and atraumatic.  Cardiovascular:     Rate and Rhythm: Normal rate and regular rhythm.  Pulmonary:     Effort: Pulmonary effort is normal. No respiratory distress.     Breath sounds: Normal breath sounds. No wheezing or rales.  Abdominal:     General: Bowel sounds are normal. There is no distension.     Palpations: Abdomen is soft.     Tenderness: There is no abdominal tenderness. There is no rebound.  Musculoskeletal:     Cervical back: Normal range of motion.  Skin:    General: Skin is warm and dry.  Neurological:     Mental Status: She is alert and oriented to person, place, and time.     Coordination: Coordination normal.     Vitals:   01/15/23 1607  BP: 120/60  Pulse: 60  Temp: 98.6 F (37 C)  TempSrc: Oral  SpO2: 98%  Weight: 202 lb (91.6 kg)  Height: 5\' 2"  (1.575 m)    Assessment & Plan:

## 2023-01-15 NOTE — Assessment & Plan Note (Signed)
Flu shot yearly. Shingrix complete. Tetanus up to date. Colonoscopy up to date. Mammogram up to date, pap smear up to date and dexa up to date. Counseled about sun safety and mole surveillance. Counseled about the dangers of distracted driving. Given 10 year screening recommendations.

## 2023-01-15 NOTE — Assessment & Plan Note (Signed)
BP at goal on chlorthalidone 25 mg daily. Checking CMP and adjust as needed.

## 2023-01-15 NOTE — Assessment & Plan Note (Signed)
Checking vitamin D and adjust as needed. 

## 2023-01-19 ENCOUNTER — Other Ambulatory Visit: Payer: Managed Care, Other (non HMO)

## 2023-01-19 LAB — COMPREHENSIVE METABOLIC PANEL
ALT: 15 U/L (ref 0–35)
AST: 20 U/L (ref 0–37)
Albumin: 4 g/dL (ref 3.5–5.2)
Alkaline Phosphatase: 62 U/L (ref 39–117)
BUN: 11 mg/dL (ref 6–23)
CO2: 27 mEq/L (ref 19–32)
Calcium: 9.5 mg/dL (ref 8.4–10.5)
Chloride: 104 mEq/L (ref 96–112)
Creatinine, Ser: 0.86 mg/dL (ref 0.40–1.20)
GFR: 72.98 mL/min (ref >60.00)
Glucose, Bld: 136 mg/dL — ABNORMAL HIGH (ref 70–99)
Potassium: 3.2 mEq/L — ABNORMAL LOW (ref 3.5–5.1)
Sodium: 138 mEq/L (ref 135–145)
Total Bilirubin: 0.6 mg/dL (ref 0.2–1.2)
Total Protein: 7.3 g/dL (ref 6.0–8.3)

## 2023-01-19 LAB — LIPID PANEL
Cholesterol: 227 mg/dL — ABNORMAL HIGH (ref 0–200)
HDL: 56.3 mg/dL (ref >39.00)
NonHDL: 170.32
Total CHOL/HDL Ratio: 4
Triglycerides: 203 mg/dL — ABNORMAL HIGH (ref 0.0–149.0)
VLDL: 40.6 mg/dL — ABNORMAL HIGH (ref 0.0–40.0)

## 2023-01-19 LAB — CBC
HCT: 40.1 % (ref 36.0–46.0)
Hemoglobin: 13.1 g/dL (ref 12.0–15.0)
MCHC: 32.6 g/dL (ref 30.0–36.0)
MCV: 92.2 fl (ref 78.0–100.0)
Platelets: 173 10*3/uL (ref 150.0–400.0)
RBC: 4.35 Mil/uL (ref 3.87–5.11)
RDW: 15 % (ref 11.5–15.5)
WBC: 3.3 10*3/uL — ABNORMAL LOW (ref 4.0–10.5)

## 2023-01-19 LAB — VITAMIN D 25 HYDROXY (VIT D DEFICIENCY, FRACTURES): VITD: 35.45 ng/mL (ref 30.00–100.00)

## 2023-01-19 LAB — LDL CHOLESTEROL, DIRECT: Direct LDL: 150 mg/dL

## 2023-01-19 LAB — HEMOGLOBIN A1C: Hgb A1c MFr Bld: 6.3 % (ref 4.6–6.5)

## 2023-01-21 LAB — TREPONEMAL ANTIBODIES, TPPA: Treponemal Antibodies, TPPA: UNDETERMINED

## 2023-01-21 LAB — RPR W/REFLEX TO TREPSURE: RPR: NONREACTIVE

## 2023-01-25 ENCOUNTER — Encounter: Payer: Self-pay | Admitting: Family

## 2023-01-25 ENCOUNTER — Ambulatory Visit (INDEPENDENT_AMBULATORY_CARE_PROVIDER_SITE_OTHER): Payer: Managed Care, Other (non HMO) | Admitting: Family

## 2023-01-25 ENCOUNTER — Other Ambulatory Visit: Payer: Self-pay

## 2023-01-25 VITALS — BP 158/60 | HR 62 | Temp 98.8°F | Resp 16 | Wt 203.8 lb

## 2023-01-25 DIAGNOSIS — A530 Latent syphilis, unspecified as early or late: Secondary | ICD-10-CM | POA: Diagnosis not present

## 2023-01-25 NOTE — Assessment & Plan Note (Signed)
Kayla Barnett has a positive treponemal antibody testing with both a positive and non-reactive RPR in the setting of no history of syphilis infection or treatment. Discussed the possibility of a false positive test, however the fact that the treponemal antibody testing is consistently positive indicates suspected previous infection. The RPR may fluctuate just below the 1:1 level which may give the negative testing and if RPR was used initially it would not trigger a treponemal antibody test to be performed. Unlikely a prozone effect in this situation. Will check lab work again today and obtain previous testing results from GYN. Discussed recommended option (pending lab work) to treat with doxycycline for 14 or 28 days to ensure no infection is indeed present. Will follow up pending lab work results.

## 2023-01-25 NOTE — Progress Notes (Signed)
Subjective:    Patient ID: Kayla Barnett, female    DOB: [birth year], 61 y.o.   MRN: [id number]  Chief Complaint  Patient presents with   New Patient (Initial Visit)    Syphillis - patient reports she had two positive RPR and one negative.     HPI:  Kayla Barnett is a 62 y.o. female with previous history of hypertension presenting today for evaluation of positive RPR titer.   Kayla Barnett was recently seen in her GYN office for her routine exam and during routine STD testing was noted to have a positive RPR titer of 1:1 with positive antibody testing. This was repeated on 01/07/23 with the same result of positive RPR titer at 1:1 with positive antibody testing. A tertiary test performed with Internal Medicine was resulted in a negative RPR and positive antibody test. No previous history of syphilis diagnosis/treatment and was previously tested by her GYN with non-reactive testing (will obtain previous results).  Has been in a monogamous relationship with no new sexual partners. Partner has not been tested as of this office visit. No current symptoms.    Allergies  Allergen Reactions   Penicillins Swelling    Has patient had a PCN reaction causing immediate rash, facial/tongue/throat swelling, SOB or lightheadedness with hypotension: yes Has patient had a PCN reaction causing severe rash involving mucus membranes or skin necrosis: No Has patient had a PCN reaction that required hospitalization No Has patient had a PCN reaction occurring within the last 10 years: Yes If all of the above answers are "NO", then may proceed with Cephalosporin use.       Outpatient Medications Prior to Visit  Medication Sig Dispense Refill   chlorthalidone (HYGROTON) 25 MG tablet Take 1 tablet (25 mg total) by mouth daily. (Patient not taking: Reported on 01/25/2023) 90 tablet 0   Vitamin D, Ergocalciferol, (DRISDOL) 1.25 MG (50000 UNIT) CAPS capsule Take 1 capsule (50,000 Units total) by mouth  every 7 (seven) days. (Patient not taking: Reported on 01/25/2023) 12 capsule 0   No facility-administered medications prior to visit.     Past Medical History:  Diagnosis Date   Heart murmur    mild MR   Pre-diabetes    Rheumatic fever    Vitamin D deficiency      Past Surgical History:  Procedure Laterality Date   child birth  [birth year]1993   CYSTECTOMY     on back 25-30 yrs ago; today is 2016   TUBAL LIGATION  2005       Review of Systems  Constitutional:  Negative for chills, diaphoresis, fatigue and fever.  Respiratory:  Negative for cough, chest tightness, shortness of breath and wheezing.   Cardiovascular:  Negative for chest pain.  Gastrointestinal:  Negative for abdominal pain, diarrhea, nausea and vomiting.      Objective:    BP (!) 158/60   Pulse 62   Temp 98.8 F (37.1 C) (Oral)   Resp 16   Wt 203 lb 12.8 oz (92.4 kg)   SpO2 98%   BMI 37.28 kg/m  Nursing note and vital signs reviewed.  Physical Exam Constitutional:      General: She is not in acute distress.    Appearance: She is well-developed.  Cardiovascular:     Rate and Rhythm: Normal rate and regular rhythm.     Heart sounds: Normal heart sounds.  Pulmonary:     Effort: Pulmonary effort is normal.     Breath sounds: Normal breath  sounds.  Skin:    General: Skin is warm and dry.  Neurological:     Mental Status: She is alert and oriented to person, place, and time.  Psychiatric:        Mood and Affect: Mood normal.         01/25/2023    3:22 PM 01/15/2023    4:10 PM 02/05/2022    7:05 AM 01/05/2022    3:25 PM 12/31/2020    3:33 PM  Depression screen PHQ 2/9  Decreased Interest 0 0 1 0 0  Down, Depressed, Hopeless 0 0 0 0 0  PHQ - 2 Score 0 0 1 0 0  Altered sleeping  0 1 0   Tired, decreased energy  0 3 0   Change in appetite  0 1 0   Feeling bad or failure about yourself   0 0 0   Trouble concentrating  0 1 0   Moving slowly or fidgety/restless  0 1 0   Suicidal thoughts  0 0 0    PHQ-9 Score  0 8 0   Difficult doing work/chores  Not difficult at all Not difficult at all         Assessment & Plan:    Patient Active Problem List   Diagnosis Date Noted   Positive RPR test 01/15/2023   Essential hypertension 05/05/2022   Eating disorder 03/18/2022   Vitamin B 12 deficiency 02/19/2022   Vitamin D deficiency 02/19/2022   Pre-diabetes 01/05/2022   Morbid obesity (HCC) 10/26/2017   Routine general medical examination at a health care facility 12/28/2014     Problem List Items Addressed This Visit       Other   Positive RPR test - Primary    Kayla Barnett has a positive treponemal antibody testing with both a positive and non-reactive RPR in the setting of no history of syphilis infection or treatment. Discussed the possibility of a false positive test, however the fact that the treponemal antibody testing is consistently positive indicates suspected previous infection. The RPR may fluctuate just below the 1:1 level which may give the negative testing and if RPR was used initially it would not trigger a treponemal antibody test to be performed. Unlikely a prozone effect in this situation. Will check lab work again today and obtain previous testing results from GYN. Discussed recommended option (pending lab work) to treat with doxycycline for 14 or 28 days to ensure no infection is indeed present. Will follow up pending lab work results.       Relevant Orders   T.pallidum Ab, Total   RPR     I am having Kayla Barnett maintain her Vitamin D (Ergocalciferol) and chlorthalidone.   Follow-up: Pending lab work results as needed .   Marcos Eke, MSN, FNP-C Nurse Practitioner [facility] Main number: [phone]

## 2023-01-25 NOTE — Patient Instructions (Signed)
Nice to see you.  We will check your lab work today.  We will request records and recommend treatment based on results.   Have a great day and stay safe!

## 2023-02-01 ENCOUNTER — Ambulatory Visit: Payer: Managed Care, Other (non HMO) | Admitting: Family

## 2023-02-03 ENCOUNTER — Other Ambulatory Visit: Payer: Self-pay | Admitting: Family

## 2023-02-03 ENCOUNTER — Telehealth: Payer: Self-pay

## 2023-02-03 MED ORDER — DOXYCYCLINE MONOHYDRATE 100 MG PO CAPS: 100.0000 mg | ORAL_CAPSULE | Freq: Two times a day (BID) | ORAL | 0 refills | Status: DC

## 2023-02-03 NOTE — Telephone Encounter (Signed)
Patient called to check status on treatment regarding recent labs. Per note patient will need to take doxy 28 days. Carver Fila, FNP sent in prescription today to preferred pharmacy.  Did explain to patient while on medication to avoid long periods of direct sunlight since doxy can increase sunburn. Also to take medication with food. Can recheck Rpr with OBGYN or PCP in 3-6 months.  Patient also requested that all future communication be relayed by phone call.  Juanita Laster, RMA

## 2023-04-08 ENCOUNTER — Other Ambulatory Visit: Payer: Self-pay

## 2023-04-08 ENCOUNTER — Emergency Department (HOSPITAL_COMMUNITY): Payer: Managed Care, Other (non HMO)

## 2023-04-08 ENCOUNTER — Encounter (HOSPITAL_COMMUNITY): Payer: Self-pay | Admitting: Emergency Medicine

## 2023-04-08 ENCOUNTER — Emergency Department (HOSPITAL_COMMUNITY)
Admission: EM | Admit: 2023-04-08 | Discharge: 2023-04-08 | Disposition: A | Discharge status: Home / Self Care | Payer: Managed Care, Other (non HMO) | Attending: Emergency Medicine | Admitting: Emergency Medicine

## 2023-04-08 DIAGNOSIS — I10 Essential (primary) hypertension: Secondary | ICD-10-CM | POA: Insufficient documentation

## 2023-04-08 DIAGNOSIS — R42 Dizziness and giddiness: Secondary | ICD-10-CM

## 2023-04-08 LAB — DIFFERENTIAL
Abs Immature Granulocytes: 0.01 10*3/uL (ref 0.00–0.07)
Basophils Absolute: 0 10*3/uL (ref 0.0–0.1)
Basophils Relative: 1 %
Eosinophils Absolute: 0.1 10*3/uL (ref 0.0–0.5)
Eosinophils Relative: 2 %
Immature Granulocytes: 0 %
Lymphocytes Relative: 55 %
Lymphs Abs: 1.8 10*3/uL (ref 0.7–4.0)
Monocytes Absolute: 0.3 10*3/uL (ref 0.1–1.0)
Monocytes Relative: 8 %
Neutro Abs: 1.2 10*3/uL — ABNORMAL LOW (ref 1.7–7.7)
Neutrophils Relative %: 34 %

## 2023-04-08 LAB — COMPREHENSIVE METABOLIC PANEL
ALT: 22 U/L (ref 0–44)
AST: 26 U/L (ref 15–41)
Albumin: 4.4 g/dL (ref 3.5–5.0)
Alkaline Phosphatase: 59 U/L (ref 38–126)
Anion gap: 11 (ref 5–15)
BUN: 11 mg/dL (ref 8–23)
CO2: 25 mmol/L (ref 22–32)
Calcium: 9.4 mg/dL (ref 8.9–10.3)
Chloride: 103 mmol/L (ref 98–111)
Creatinine, Ser: 1 mg/dL (ref 0.44–1.00)
GFR, Estimated: >60 mL/min (ref >60)
Glucose, Bld: 109 mg/dL — ABNORMAL HIGH (ref 70–99)
Potassium: 3.5 mmol/L (ref 3.5–5.1)
Sodium: 139 mmol/L (ref 135–145)
Total Bilirubin: 1.1 mg/dL (ref 0.3–1.2)
Total Protein: 8.1 g/dL (ref 6.5–8.1)

## 2023-04-08 LAB — URINALYSIS, ROUTINE W REFLEX MICROSCOPIC
Bilirubin Urine: NEGATIVE
Glucose, UA: NEGATIVE mg/dL
Hgb urine dipstick: NEGATIVE
Ketones, ur: NEGATIVE mg/dL
Leukocytes,Ua: NEGATIVE
Nitrite: NEGATIVE
Protein, ur: NEGATIVE mg/dL
Specific Gravity, Urine: 1.005 (ref 1.005–1.030)
pH: 7 (ref 5.0–8.0)

## 2023-04-08 LAB — CBC
HCT: 43.6 % (ref 36.0–46.0)
Hemoglobin: 14.2 g/dL (ref 12.0–15.0)
MCH: 30.1 pg (ref 26.0–34.0)
MCHC: 32.6 g/dL (ref 30.0–36.0)
MCV: 92.6 fL (ref 80.0–100.0)
Platelets: 199 10*3/uL (ref 150–400)
RBC: 4.71 MIL/uL (ref 3.87–5.11)
RDW: 13.3 % (ref 11.5–15.5)
WBC: 3.4 10*3/uL — ABNORMAL LOW (ref 4.0–10.5)
nRBC: 0 % (ref 0.0–0.2)

## 2023-04-08 LAB — CBG MONITORING, ED: Glucose-Capillary: 100 mg/dL — ABNORMAL HIGH (ref 70–99)

## 2023-04-08 MED ORDER — SODIUM CHLORIDE (PF) 0.9 % IJ SOLN
INTRAMUSCULAR | Status: AC
  Filled 2023-04-08: qty 50

## 2023-04-08 MED ORDER — MECLIZINE HCL 25 MG PO TABS
25.0000 mg | ORAL_TABLET | Freq: Once | ORAL | Status: AC
  Administered 2023-04-08: 25 mg via ORAL
  Filled 2023-04-08: qty 1

## 2023-04-08 MED ORDER — MECLIZINE HCL 25 MG PO TABS: 25.0000 mg | ORAL_TABLET | Freq: Three times a day (TID) | ORAL | 0 refills | Status: AC | PRN

## 2023-04-08 MED ORDER — IOHEXOL 350 MG/ML SOLN
75.0000 mL | Freq: Once | INTRAVENOUS | Status: AC | PRN
  Administered 2023-04-08: 75 mL via INTRAVENOUS

## 2023-04-08 NOTE — Discharge Instructions (Signed)
Your MRI and CT scan today showed an abnormality of your left vertebral artery.  We discussed this with neurology who does not think this is causing your symptoms.  You need to call the neurology clinic to schedule follow-up for this.  Your symptoms are likely due to peripheral vertigo.  You should follow-up with your primary care doctor and can take the prescribed meclizine for this but this may make you drowsy.  If you develop dizziness that does not go away, severe headache, weakness, numbness, vision changes you should return to the ED.

## 2023-04-08 NOTE — ED Notes (Signed)
Pt back from MRI 

## 2023-04-08 NOTE — ED Provider Notes (Signed)
Handoff received from prior provider.  Patient presented with dizziness, discussed with neurology who recommended CTA as well as MRI.  On reevaluation she is asymptomatic.  She reports she woke up at 3 AM feeling dizzy and off-balance.  She states she had abnormal sensation in her left eye as well as difficulty hearing in her left ear.  She also felt off balance when she tried to walk.  Symptoms are now resolved.  CT head with prior strokes.  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  MRI is pending.  Talked with Dr. Derry Lory  with neurology, reviewed case and imaging findings.  Don't suspect imaging findings explain her symptoms.  Suspect incidental finding.  OP follow up with neurology.  Patient remains asymptomatic.  Discussed imaging findings and need for OP follow up with neurology.  Discharged with return precautions.   Laurence Spates, MD 04/08/23 [phone]

## 2023-04-08 NOTE — ED Provider Notes (Signed)
[place] EMERGENCY DEPARTMENT AT [facility] Provider Note   CSN: [id number] Arrival date & time: 04/08/23  0502     History  Chief Complaint  Patient presents with   Dizziness    Kayla Barnett is a 62 y.o. female.  The history is provided by the patient.  Dizziness Kayla Barnett is a 62 y.o. female who presents to the Emergency Department complaining of dizziness.  She presents to the emergency department for evaluation of dizziness that started when she woke at 3:00.  She was well when she went to bed at 10 PM.  She states that she woke up feeling dizzy and off balance, described as an equilibrium issue.  She did have some changes to her left eye that she describes as a feeling like she had sleep in it but this is now gone.  No numbness, weakness, headache.  She does feel somewhat like she has a crick in her neck on the left side but no significant pain.  No prior similar symptoms.  She also reports some fullness to her left ear.  No prior similar symptoms.  She has no known medical problems and takes no routine medications.  No tobacco.  Occasional alcohol, no drugs.  She has a hx/o HTN but no longer requires meds.     Home Medications Prior to Admission medications   Medication Sig Start Date End Date Taking? Authorizing Provider  doxycycline (MONODOX) 100 MG capsule Take 1 capsule (100 mg total) by mouth 2 (two) times daily. 02/03/23   Veryl Speak, FNP      Allergies    Penicillins    Review of Systems   Review of Systems  Neurological:  Positive for dizziness.  All other systems reviewed and are negative.   Physical Exam Updated Vital Signs BP (!) 189/92 (BP Location: Left Arm)   Pulse 63   Temp 98.6 F (37 C) (Oral)   Resp 16   SpO2 100%  Physical Exam Vitals and nursing note reviewed.  Constitutional:      Appearance: She is well-developed.  HENT:     Head: Normocephalic and atraumatic.     Comments: Right TM obscured by cerumen.   Left TM within normal limits. Eyes:     Extraocular Movements: Extraocular movements intact.     Pupils: Pupils are equal, round, and reactive to light.  Cardiovascular:     Rate and Rhythm: Normal rate and regular rhythm.     Heart sounds: No murmur heard. Pulmonary:     Effort: Pulmonary effort is normal. No respiratory distress.     Breath sounds: Normal breath sounds.  Abdominal:     Palpations: Abdomen is soft.     Tenderness: There is no abdominal tenderness. There is no guarding or rebound.  Musculoskeletal:        General: No tenderness.  Skin:    General: Skin is warm and dry.  Neurological:     Mental Status: She is alert and oriented to person, place, and time.     Comments: No asymmetry of facial movements.  Visual fields grossly intact.  No pronator drift.  5 out of 5 strength in all 4 extremities with sensation to light touch intact in all 4 extremities.  No ataxia on finger-nose bilaterally.  Normal gait.  Psychiatric:        Behavior: Behavior normal.     ED Results / Procedures / Treatments   Labs (all labs ordered are listed, but  only abnormal results are displayed) Labs Reviewed  CBG MONITORING, ED - Abnormal; Notable for the following components:      Result Value   Glucose-Capillary 100 (*)    All other components within normal limits  CBC  URINALYSIS, ROUTINE W REFLEX MICROSCOPIC    EKG None  Radiology No results found.  Procedures Procedures    Medications Ordered in ED Medications  meclizine (ANTIVERT) tablet 25 mg (has no administration in time range)    ED Course/ Medical Decision Making/ A&P Clinical Course as of 04/08/23 0953  Thu Apr 08, 2023  1610 Dizziness, suspect peripheral, discussed with neuro, CTA, MRI, DC if okay [JD]  0917 L eye, L ear, off balance.  Now asymptomatic [JD]    Clinical Course User Index [JD] Laurence Spates, MD                                 Medical Decision Making Amount and/or Complexity of Data  Reviewed Labs: ordered. Radiology: ordered.  Risk Prescription drug management.   Pt here for evaluation of disqequilibrium/vertigo for two hours, sxs improving at time of ED arrival.  No appreciable deficits on exam.  Of note, pt is hypertensive in ED.  She is not currently on BP meds but has been in the past.  Given her newly elevated BP with sxs a CT head was obtained, which demonstrates chronic bilateral cerebellar infarcts.   D/w pt finding of CT scan - pt unaware of prior infarcts.  D/w Neurologist - given imaging findings recommendation for CTA head/neck as well as MRI to eval for acute process.  Pt care transferred pending additional imaging.          Final Clinical Impression(s) / ED Diagnoses Final diagnoses:  None    Rx / DC Orders ED Discharge Orders     None         Tilden Fossa, MD 04/08/23 [phone]

## 2023-04-08 NOTE — ED Triage Notes (Addendum)
Pt reports feeling "off balance" upon wakening & reports ear fullness on right side. Neuro intact in triage. LKW 10pm last night

## 2023-05-04 ENCOUNTER — Encounter: Payer: Self-pay | Admitting: Internal Medicine

## 2023-05-04 ENCOUNTER — Ambulatory Visit (INDEPENDENT_AMBULATORY_CARE_PROVIDER_SITE_OTHER): Payer: Managed Care, Other (non HMO) | Admitting: Internal Medicine

## 2023-05-04 DIAGNOSIS — I10 Essential (primary) hypertension: Secondary | ICD-10-CM | POA: Diagnosis not present

## 2023-05-04 DIAGNOSIS — R7303 Prediabetes: Secondary | ICD-10-CM | POA: Diagnosis not present

## 2023-05-04 DIAGNOSIS — Z8673 Personal history of transient ischemic attack (TIA), and cerebral infarction without residual deficits: Secondary | ICD-10-CM

## 2023-05-04 MED ORDER — METFORMIN HCL 500 MG PO TABS: 500.0000 mg | ORAL_TABLET | Freq: Every day | ORAL | 3 refills | Status: DC

## 2023-05-04 NOTE — Patient Instructions (Addendum)
We have sent in metformin to take daily for the weight to help.  We have cleaned out the ear today.

## 2023-05-07 DIAGNOSIS — Z8673 Personal history of transient ischemic attack (TIA), and cerebral infarction without residual deficits: Secondary | ICD-10-CM | POA: Insufficient documentation

## 2023-05-07 NOTE — Assessment & Plan Note (Signed)
Prior lacunar infarct found on imaging. She has BP slightly above goal today due to concern over these recent findings and no known diabetes. No A fib known. Likely due to elevated BP in the past she has had labile pressures.

## 2023-05-07 NOTE — Assessment & Plan Note (Signed)
BP mildly elevated today and previously on medication. If BP persistently elevated should resume medication.

## 2023-05-07 NOTE — Progress Notes (Signed)
   Subjective:   Patient ID: Kayla Barnett, female    DOB: 09/28/1960, 62 y.o.   MRN: [id number]  HPI The patient is a 62 YO female coming in for ER follow up (episode of dizziness and had CT and angiogram and MRI no current stroke but past lacunar infarcts, dizziness resolved while in ER). She has not had recurrence of dizziness but has many questions and has been shaken up about the information she received.   Review of Systems  Constitutional: Negative.   HENT: Negative.    Eyes: Negative.   Respiratory:  Negative for cough, chest tightness and shortness of breath.   Cardiovascular:  Negative for chest pain, palpitations and leg swelling.  Gastrointestinal:  Negative for abdominal distention, abdominal pain, constipation, diarrhea, nausea and vomiting.  Musculoskeletal: Negative.   Skin: Negative.   Neurological:  Positive for dizziness.  Psychiatric/Behavioral: Negative.      Objective:  Physical Exam Constitutional:      Appearance: She is well-developed.  HENT:     Head: Normocephalic and atraumatic.  Cardiovascular:     Rate and Rhythm: Normal rate and regular rhythm.  Pulmonary:     Effort: Pulmonary effort is normal. No respiratory distress.     Breath sounds: Normal breath sounds. No wheezing or rales.  Abdominal:     General: Bowel sounds are normal. There is no distension.     Palpations: Abdomen is soft.     Tenderness: There is no abdominal tenderness. There is no rebound.  Musculoskeletal:     Cervical back: Normal range of motion.  Skin:    General: Skin is warm and dry.  Neurological:     Mental Status: She is alert and oriented to person, place, and time.     Coordination: Coordination normal.     Vitals:   05/04/23 1554 05/04/23 1559  BP: (!) 157/85 (!) 157/85  Pulse: 64   Temp: 97.6 F (36.4 C)   TempSrc: Oral   SpO2: 98%   Weight: 203 lb (92.1 kg)   Height: 5\' 2"  (1.575 m)    Visit time 20 minutes in face to face communication with  patient and coordination of care, additional 11 minutes spent in record review, coordination or care, ordering tests, communicating/referring to other healthcare professionals, documenting in medical records all on the same day of the visit for total time 31 minutes spent on the visit.   Assessment & Plan:

## 2023-05-07 NOTE — Assessment & Plan Note (Signed)
Starting metformin 500 mg daily for weight loss and prevention of diabetes.

## 2023-07-27 ENCOUNTER — Encounter: Payer: Self-pay | Admitting: Neurology

## 2023-07-27 ENCOUNTER — Ambulatory Visit (INDEPENDENT_AMBULATORY_CARE_PROVIDER_SITE_OTHER): Payer: Managed Care, Other (non HMO) | Admitting: Neurology

## 2023-07-27 VITALS — BP 132/84 | HR 86 | Ht 62.0 in | Wt 194.0 lb

## 2023-07-27 DIAGNOSIS — R42 Dizziness and giddiness: Secondary | ICD-10-CM | POA: Diagnosis not present

## 2023-07-27 DIAGNOSIS — I679 Cerebrovascular disease, unspecified: Secondary | ICD-10-CM

## 2023-07-27 NOTE — Progress Notes (Signed)
Chief Complaint  Patient presents with   New Patient (Initial Visit)    Pt in 14, here alone Pt is referred for vertigo and old lacunar infarcts seen in MRI. Pt states the dizziness come and go. Pt states the dizziness feels more like being off balance than a spinning sensation. Pt states has had 4 episodes of dizziness within the past 4 months.       ASSESSMENT AND PLAN  Kayla Barnett is a 63 y.o. 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   MEDICAL HISTORY:  Kayla Barnett, is a 63 year old female seen in request by her primary care doctor Hillard Danker for evaluation of dizziness, abnormal MRI of the brain  History is obtained from the patient and review of electronic medical records. I personally reviewed pertinent available imaging films in PACS.   PMHx of  Obesity Pre-DM  April 08, 2023, she got up around 4 AM, felt dizziness, mild unsteady gait, she drove herself to hospital, was noted to have significantly elevated blood pressure 189/92, she denies headache, symptoms last about 1 hour gradually resolved, there was no residual deficit  Workup including MRI of the brain showed chronic lacunar infarction involving bilateral cerebellum hemisphere, there was no acute abnormality  CT angiogram of head and neck showed  evidence of intracranial atherosclerotic disease, no significant cervical carotid artery stenosis,  Lab in 2024, A1c was 6.3, LDL was 150, she has a history of syphilis was treated with antibiotic in 2024, antibody remain positive,  Over past few months, she has intermittent transient dizziness sensation, not vertigo, lasting for few minutes, no headache  PHYSICAL EXAM:   Vitals:   07/27/23 1549  BP: 132/84  Pulse: 86  Weight: 194 lb (88 kg)  Height: 5\' 2"  (1.575 m)   Not recorded     Body mass index is 35.48 kg/m.  PHYSICAL EXAMNIATION:  Gen: NAD, conversant, well nourised, well groomed                     Cardiovascular: Regular rate rhythm, no peripheral edema, warm, nontender. Eyes: Conjunctivae clear without exudates or hemorrhage Neck: Supple, no carotid bruits. Pulmonary: Clear to auscultation bilaterally   NEUROLOGICAL EXAM:  MENTAL STATUS: Speech/cognition: Awake, alert, oriented to history taking and casual conversation CRANIAL NERVES: CN II: Visual fields are full to confrontation. Pupils are round equal and briskly reactive to light. CN III, IV, VI: extraocular movement are normal. No ptosis. CN V: Facial sensation is intact to light touch CN VII: Face is symmetric with normal eye closure  CN VIII: Hearing is normal to causal conversation. CN IX, X: Phonation is normal. CN XI: Head turning and shoulder shrug are intact  MOTOR: There is no pronator drift of out-stretched arms. Muscle bulk and tone are normal. Muscle strength is normal.  REFLEXES: Reflexes are 2+ and symmetric at the biceps, triceps, knees, and ankles. Plantar responses are flexor.  SENSORY: Intact to light touch, pinprick and vibratory sensation are intact in fingers and toes.  COORDINATION: There is no trunk or limb dysmetria noted.  GAIT/STANCE: Posture is normal. Gait is steady with normal steps, base, arm swing, and turning. Heel and toe walking are normal. Tandem gait is  normal.  Romberg is absent.  Epley's maneuver right ear dependent position, did not trigger vertigo or nystagmus,  REVIEW OF SYSTEMS:  Full 14 system review of systems performed and notable only for as above All other review of systems were negative.   ALLERGIES: Allergies  Allergen Reactions   Penicillins Swelling    Has patient had a PCN reaction causing immediate rash, facial/tongue/throat swelling, SOB or lightheadedness with hypotension: yes Has patient had a PCN reaction causing severe rash involving mucus membranes or skin necrosis: No Has patient had a PCN reaction that required hospitalization No Has patient had a PCN reaction occurring within the last 10 years: Yes If all of the above answers are "NO", then may proceed with Cephalosporin use.     HOME MEDICATIONS: Current Outpatient Medications  Medication Sig Dispense Refill   Moringa 500 MG CAPS Take by mouth.     meclizine (ANTIVERT) 25 MG tablet Take 1 tablet (25 mg total) by mouth 3 (three) times daily as needed for dizziness. (Patient not taking: Reported on 07/27/2023) 15 tablet 0   metFORMIN (GLUCOPHAGE) 500 MG tablet Take 1 tablet (500 mg total) by mouth daily with breakfast. (Patient not taking: Reported on 07/27/2023) 90 tablet 3   No current facility-administered medications for this visit.    PAST MEDICAL HISTORY: Past Medical History:  Diagnosis Date   Heart murmur    mild MR   Pre-diabetes    Rheumatic fever    Vitamin D deficiency     PAST SURGICAL HISTORY: Past Surgical History:  Procedure Laterality Date   child birth  [birth year]1993   CYSTECTOMY     on back 25-30 yrs ago; today is 2016   TUBAL LIGATION  2005    FAMILY HISTORY: Family History  Problem Relation Age of Onset   Migraines Mother    High blood pressure Mother    High Cholesterol Mother    Colon cancer Neg Hx    Colon polyps Neg Hx    Stroke Neg Hx    Seizures Neg Hx    Parkinsonism Neg Hx     SOCIAL HISTORY: Social History    Socioeconomic History   Marital status: Single    Spouse name: Not on file   Number of children: Not on file   Years of education: Not on file   Highest education level: Not on file  Occupational History   Not on file  Tobacco Use   Smoking status: Never   Smokeless tobacco: Never  Vaping Use   Vaping status: Never Used  Substance and Sexual Activity   Alcohol use: Yes    Alcohol/week: 3.0 standard drinks of alcohol    Types: 1 Glasses of wine, 1 Cans of beer, 1 Shots of liquor per week    Comment: occasional   Drug use: No   Sexual activity: Not on file  Other Topics Concern   Not on file  Social History Narrative   Not on file   Social Drivers of Health   Financial Resource Strain: Not on file  Food Insecurity: Not on file  Transportation Needs: Not on file  Physical Activity: Not on file  Stress: Not on file  Social  Connections: Not on file  Intimate Partner Violence: Not on file      Levert Feinstein, M.D. Ph.D.  [facility] [address] [place], [place] [address] Ph: [phone] Fax: [phone]  CC:  Laurence Spates, MD [address] [place],  [place] [address]  Myrlene Broker, MD

## 2023-11-03 ENCOUNTER — Other Ambulatory Visit: Payer: Self-pay

## 2024-01-20 ENCOUNTER — Ambulatory Visit: Admitting: Infectious Diseases

## 2024-01-24 ENCOUNTER — Encounter: Admitting: Internal Medicine

## 2024-02-03 ENCOUNTER — Ambulatory Visit (INDEPENDENT_AMBULATORY_CARE_PROVIDER_SITE_OTHER): Admitting: Infectious Diseases

## 2024-02-03 ENCOUNTER — Other Ambulatory Visit: Payer: Self-pay

## 2024-02-03 ENCOUNTER — Encounter: Payer: Self-pay | Admitting: Infectious Diseases

## 2024-02-03 VITALS — BP 143/81 | HR 66 | Temp 98.7°F | Wt 185.0 lb

## 2024-02-03 DIAGNOSIS — Z113 Encounter for screening for infections with a predominantly sexual mode of transmission: Secondary | ICD-10-CM | POA: Insufficient documentation

## 2024-02-03 DIAGNOSIS — Z8619 Personal history of other infectious and parasitic diseases: Secondary | ICD-10-CM | POA: Diagnosis not present

## 2024-02-03 DIAGNOSIS — A530 Latent syphilis, unspecified as early or late: Secondary | ICD-10-CM

## 2024-02-03 NOTE — Progress Notes (Unsigned)
 Patient Active Problem List   Diagnosis Date Noted   Cerebral vascular disease 07/27/2023   Dizziness 07/27/2023   History of completed stroke 05/07/2023   Positive RPR test 01/15/2023   Essential hypertension 05/05/2022   Eating disorder 03/18/2022   Vitamin B 12 deficiency 02/19/2022   Vitamin D  deficiency 02/19/2022   Pre-diabetes 01/05/2022   Morbid obesity (HCC) 10/26/2017   Routine general medical examination at a health care facility 12/28/2014    Patient's Medications  New Prescriptions   No medications on file  Previous Medications   MECLIZINE  (ANTIVERT ) 25 MG TABLET    Take 1 tablet (25 mg total) by mouth 3 (three) times daily as needed for dizziness.   METFORMIN  (GLUCOPHAGE ) 500 MG TABLET    Take 1 tablet (500 mg total) by mouth daily with breakfast.   MORINGA 500 MG CAPS    Take by mouth.  Modified Medications   No medications on file  Discontinued Medications   No medications on file    Subjective: Discussed the use of AI scribe software for clinical note transcription with the patient, who gave verbal consent to proceed.   63 Y O female with prior h/o rheumatic fever, prediabetes, Vit D deficiency who is referred back for positive syphilis serology.   Patient was tested positive with reactive RPR, titre 1:1 and positive T pallidum antibodies. Patient last seen here in 01/25/23 when had exact same results. 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   She reports being sexually active with same female partner for 11 years, unsure if he has been tested and treated. No other symptoms including GI, rashes, no new partners. She reports completing course of po doxycycline  last time without missing doses or concerns.   She has no complaints.   Review of Systems: all systems reviewed with pertinent positives and negatives as listed above   Past Medical History:  Diagnosis Date   Heart murmur    mild MR   Pre-diabetes     Rheumatic fever    Vitamin D  deficiency    Past Surgical History:  Procedure Laterality Date   child birth  [birth year]1993   CYSTECTOMY     on back 25-30 yrs ago; today is 2016   TUBAL LIGATION  2005    Social History   Tobacco Use   Smoking status: Never   Smokeless tobacco: Never  Vaping Use   Vaping status: Never Used  Substance Use Topics   Alcohol use: Yes    Alcohol/week: 3.0 standard drinks of alcohol    Types: 1 Glasses of wine, 1 Cans of beer, 1 Shots of liquor per week    Comment: occasional   Drug use: No    Family History  Problem Relation Age of Onset   Migraines Mother    High blood pressure Mother    High Cholesterol Mother    Colon cancer Neg Hx    Colon polyps Neg Hx    Stroke Neg Hx    Seizures Neg Hx    Parkinsonism Neg Hx     Allergies  Allergen Reactions   Penicillins Swelling    Has patient had a PCN reaction causing immediate rash, facial/tongue/throat swelling, SOB or lightheadedness with hypotension: yes Has patient had a PCN reaction causing severe rash involving mucus membranes or skin necrosis: No Has patient had a PCN reaction that required hospitalization No Has patient had a PCN reaction occurring  within the last 10 years: Yes If all of the above answers are NO, then may proceed with Cephalosporin use.     Health Maintenance  Topic Date Due   Pneumococcal Vaccine: 50+ Years (1 of 1 - PCV) Never done   MAMMOGRAM  06/22/2019   Cervical Cancer Screening (HPV/Pap Cotest)  06/21/2020   COVID-19 Vaccine (3 - 2024-25 season) 02/28/2023   INFLUENZA VACCINE  01/28/2024   Colonoscopy  05/30/2025   DTaP/Tdap/Td (3 - Td or Tdap) 02/14/2027   Hepatitis C Screening  Completed   HIV Screening  Completed   Zoster Vaccines- Shingrix   Completed   Hepatitis B Vaccines  Aged Out   HPV VACCINES  Aged Out   Meningococcal B Vaccine  Aged Out    Objective: BP (!) 143/81   Pulse 66   Temp 98.7 F (37.1 C) (Oral)   Wt 185 lb (83.9 kg)   SpO2  98%   BMI 33.84 kg/m    Physical Exam Constitutional:      Appearance: Normal appearance.  HENT:     Head: Normocephalic and atraumatic.      Mouth: Mucous membranes are moist.  Eyes:    Conjunctiva/sclera: Conjunctivae normal.     Pupils: Pupils are equal, round, and b/l symmetrical    Cardiovascular:     Rate and Rhythm: Normal rate and regular rhythm.     Heart sounds: s1s2  Pulmonary:     Effort: Pulmonary effort is normal.     Breath sounds: Normal breath sounds.   Abdominal:     General: Non distended     Palpations: sof  Musculoskeletal:        General: Normal range of motion.   Skin:    General: Skin is warm and dry.     Comments:  Neurological:     General: grossly non focal     Mental Status: awake, alert and oriented to person, place, and time.   Psychiatric:        Mood and Affect: Mood normal.   Lab Results Lab Results  Component Value Date   WBC 3.4 (L) 04/08/2023   HGB 14.2 04/08/2023   HCT 43.6 04/08/2023   MCV 92.6 04/08/2023   PLT 199 04/08/2023    Lab Results  Component Value Date   CREATININE 1.00 04/08/2023   BUN 11 04/08/2023   NA 139 04/08/2023   K 3.5 04/08/2023   CL 103 04/08/2023   CO2 25 04/08/2023    Lab Results  Component Value Date   ALT 22 04/08/2023   AST 26 04/08/2023   ALKPHOS 59 04/08/2023   BILITOT 1.1 04/08/2023    Lab Results  Component Value Date   CHOL 227 (H) 01/18/2023   HDL 56.30 01/18/2023   LDLCALC 138 (H) 01/05/2022   LDLDIRECT 150.0 01/18/2023   TRIG 203.0 (H) 01/18/2023   CHOLHDL 4 01/18/2023   Lab Results  Component Value Date   LABRPR REACTIVE (A) 01/25/2023   RPRTITER 1:1 (H) 01/25/2023   No results found for: HIV1RNAQUANT, HIV1RNAVL, CD4TABS   Assessment/Plan # Late latent syphilis  - s/p tx with 28 days of doxycycline   - 01/13/24 RPR with titre of 1:1 likely serofast state and no need for further tx - safe sex counseling  - discussed her partner should be tested and treated  as well  # STD screening  - 01/13/24 HIV, Hepatitis B and C negative  I spent 37 minutes involved in face-to-face and non-face-to-face activities for this  patient on the day of the visit. Professional time spent includes the following activities: Preparing to see the patient (review of tests), Obtaining and reviewing separately obtained history (Greg calone note 01/25/2023), Performing a medically appropriate examination and evaluation, Documenting clinical information in the EMR, Independently interpreting results (not separately reported), Communicating results to the patient, Counseling and educating the patient.   Of note, portions of this note may have been created with voice recognition software. While this note has been edited for accuracy, occasional wrong-word or 'sound-a-like' substitutions may have occurred due to the inherent limitations of voice recognition software.   Annalee Joseph, MD [facility] for Infectious Disease [facility] 02/03/2024, 3:43 PM

## 2024-02-08 ENCOUNTER — Encounter: Admitting: Internal Medicine

## 2024-02-22 ENCOUNTER — Ambulatory Visit: Admitting: Internal Medicine

## 2024-02-22 ENCOUNTER — Encounter: Payer: Self-pay | Admitting: Internal Medicine

## 2024-02-22 VITALS — BP 122/80 | HR 74 | Temp 98.3°F | Ht 62.0 in | Wt 185.0 lb

## 2024-02-22 DIAGNOSIS — I10 Essential (primary) hypertension: Secondary | ICD-10-CM | POA: Diagnosis not present

## 2024-02-22 DIAGNOSIS — R7303 Prediabetes: Secondary | ICD-10-CM

## 2024-02-22 DIAGNOSIS — E538 Deficiency of other specified B group vitamins: Secondary | ICD-10-CM

## 2024-02-22 DIAGNOSIS — E559 Vitamin D deficiency, unspecified: Secondary | ICD-10-CM

## 2024-02-22 DIAGNOSIS — Z0000 Encounter for general adult medical examination without abnormal findings: Secondary | ICD-10-CM

## 2024-02-22 DIAGNOSIS — Z8673 Personal history of transient ischemic attack (TIA), and cerebral infarction without residual deficits: Secondary | ICD-10-CM

## 2024-02-22 NOTE — Progress Notes (Unsigned)
   Subjective:   Patient ID: Kayla Barnett, female    DOB: September 01, 1960, 63 y.o.   MRN: [id number]  The patient is here for physical. Pertinent topics discussed: Discussed the use of AI scribe software for clinical note transcription with the patient, who gave verbal consent to proceed.  History of Present Illness Kayla Barnett is a 63 year old female who presents with transient right-sided weakness and speech difficulty.  She experienced a transient episode of right-sided weakness and difficulty speaking while moving into a new house. The episode lasted approximately 20 minutes and resolved after resting. She describes the sensation as her foot not moving and her lips not working, but these symptoms have not recurred since.  She is engaged and has recently moved into a new house.  PMH, [facility], social history reviewed and updated  Review of Systems  Constitutional: Negative.   HENT: Negative.    Eyes: Negative.   Respiratory:  Negative for cough, chest tightness and shortness of breath.   Cardiovascular:  Negative for chest pain, palpitations and leg swelling.  Gastrointestinal:  Negative for abdominal distention, abdominal pain, constipation, diarrhea, nausea and vomiting.  Musculoskeletal: Negative.   Skin: Negative.   Neurological: Negative.   Psychiatric/Behavioral: Negative.      Objective:  Physical Exam Constitutional:      Appearance: She is well-developed.  HENT:     Head: Normocephalic and atraumatic.  Cardiovascular:     Rate and Rhythm: Normal rate and regular rhythm.  Pulmonary:     Effort: Pulmonary effort is normal. No respiratory distress.     Breath sounds: Normal breath sounds. No wheezing or rales.  Abdominal:     General: Bowel sounds are normal. There is no distension.     Palpations: Abdomen is soft.     Tenderness: There is no abdominal tenderness. There is no rebound.  Musculoskeletal:     Cervical back: Normal range of motion.  Skin:     General: Skin is warm and dry.  Neurological:     Mental Status: She is alert and oriented to person, place, and time.     Coordination: Coordination normal.     Vitals:   02/22/24 1558  BP: 122/80  Pulse: 74  Temp: 98.3 F (36.8 C)  TempSrc: Oral  SpO2: 93%  Weight: 185 lb (83.9 kg)  Height: 5' 2 (1.575 m)    Assessment & Plan:

## 2024-02-23 LAB — CBC
HCT: 43 % (ref 36.0–46.0)
Hemoglobin: 14 g/dL (ref 12.0–15.0)
MCHC: 32.4 g/dL (ref 30.0–36.0)
MCV: 92.3 fl (ref 78.0–100.0)
Platelets: 175 K/uL (ref 150.0–400.0)
RBC: 4.66 Mil/uL (ref 3.87–5.11)
RDW: 14.3 % (ref 11.5–15.5)
WBC: 4.4 K/uL (ref 4.0–10.5)

## 2024-02-23 LAB — LIPID PANEL
Cholesterol: 211 mg/dL — ABNORMAL HIGH (ref 0–200)
HDL: 57.1 mg/dL (ref >39.00)
LDL Cholesterol: 130 mg/dL — ABNORMAL HIGH (ref 0–99)
NonHDL: 154.26
Total CHOL/HDL Ratio: 4
Triglycerides: 123 mg/dL (ref 0.0–149.0)
VLDL: 24.6 mg/dL (ref 0.0–40.0)

## 2024-02-23 LAB — VITAMIN B12: Vitamin B-12: 352 pg/mL (ref 211–911)

## 2024-02-23 LAB — COMPREHENSIVE METABOLIC PANEL WITH GFR
ALT: 15 U/L (ref 0–35)
AST: 21 U/L (ref 0–37)
Albumin: 4.1 g/dL (ref 3.5–5.2)
Alkaline Phosphatase: 69 U/L (ref 39–117)
BUN: 18 mg/dL (ref 6–23)
CO2: 26 meq/L (ref 19–32)
Calcium: 9.6 mg/dL (ref 8.4–10.5)
Chloride: 105 meq/L (ref 96–112)
Creatinine, Ser: 1.02 mg/dL (ref 0.40–1.20)
GFR: 59.01 mL/min — ABNORMAL LOW (ref >60.00)
Glucose, Bld: 141 mg/dL — ABNORMAL HIGH (ref 70–99)
Potassium: 4.2 meq/L (ref 3.5–5.1)
Sodium: 142 meq/L (ref 135–145)
Total Bilirubin: 0.4 mg/dL (ref 0.2–1.2)
Total Protein: 7.6 g/dL (ref 6.0–8.3)

## 2024-02-23 LAB — VITAMIN D 25 HYDROXY (VIT D DEFICIENCY, FRACTURES): VITD: 31.05 ng/mL (ref 30.00–100.00)

## 2024-02-23 LAB — HEMOGLOBIN A1C: Hgb A1c MFr Bld: 6.5 % (ref 4.6–6.5)

## 2024-02-23 NOTE — Assessment & Plan Note (Signed)
Checking B12 and adjust as needed. 

## 2024-02-23 NOTE — Assessment & Plan Note (Signed)
 Checking vitamin D  and adjust as needed.

## 2024-02-23 NOTE — Assessment & Plan Note (Signed)
 Flu shot yearly. Pneumonia up to date. Shingrix  complete. Tetanus up to date. Colonoscopy up to date. Mammogram up to date, pap smear up to date. Counseled about sun safety and mole surveillance. Counseled about the dangers of distracted driving. Given 10 year screening recommendations.

## 2024-02-23 NOTE — Assessment & Plan Note (Signed)
Checking HgA1c. 

## 2024-02-23 NOTE — Assessment & Plan Note (Signed)
 No new symptoms checking HgA1c, lipid panel and BP at goal.

## 2024-02-23 NOTE — Assessment & Plan Note (Signed)
 BP at goal checking CMP and adjust as needed on no meds.

## 2024-02-29 ENCOUNTER — Ambulatory Visit: Payer: Self-pay | Admitting: Internal Medicine

## 2024-03-06 MED ORDER — PRAVASTATIN SODIUM 20 MG PO TABS: 20.0000 mg | ORAL_TABLET | Freq: Every day | ORAL | 3 refills | Status: AC

## 2024-03-06 NOTE — Telephone Encounter (Signed)
 Copied from CRM [id number]. Topic: Clinical - Lab/Test Results >> Mar 06, 2024  1:19 PM Rea ORN wrote: Reason for CRM: Pt called to speak to CMA regarding medication that PCP wants to start her on for cholesterol. Please call back [phone]

## 2024-03-29 ENCOUNTER — Ambulatory Visit: Payer: Self-pay

## 2024-03-29 NOTE — Telephone Encounter (Signed)
 FYI Only or Action Required?: Action required by provider: clinical question for provider.  Patient was last seen in primary care on 02/22/2024 by Rollene Almarie LABOR, MD.  Called Nurse Triage reporting Sore Throat.  Symptoms began a week ago.  Interventions attempted: Nothing.  Symptoms are: unchanged.  Triage Disposition: See Physician Within 24 Hours  Patient/caregiver understands and will follow disposition?: No, wishes to speak with PCP  Copied from CRM #[id number]. Topic: Clinical - Red Word Triage >> Mar 29, 2024  4:47 PM Wess RAMAN wrote: Red Word that prompted transfer to Nurse Triage: Cold for 1-2 weeks.  Stuffy nose, congestion, cough, sore throat, sweats, chills,  Pharmacy: [facility] [address], [place] - [address] [place] [address] Phone: [phone] Fax: [phone] Hours: Not open 24 hours Reason for Disposition  SEVERE throat pain (e.g., excruciating)  Answer Assessment - Initial Assessment Questions No available appts. Advised UC today, ED if symptoms worsen.  Pt reports will go to UC if feeling worse. Patient requesting Call Back/prescription for sore throat  1. ONSET: When did the throat start hurting? (Hours or days ago)      2 weeks ago 2. SEVERITY: How bad is the sore throat? (Scale 1-10; mild, moderate or severe)     7/10; no meds; denies problems swallowing 3. STREP EXPOSURE: Has there been any exposure to strep within the past week? If Yes, ask: What type of contact occurred?      no 4.  VIRAL SYMPTOMS: Are there any symptoms of a cold, such as a runny nose, cough, hoarse voice or red eyes?    Chest congestion, cough-yellow, sore throat, sweats, denies body aches 5. FEVER: Do you have a fever? If Yes, ask: What is your temperature, how was it measured, and when did it start?     Denies fever, Reports chills 6. PUS ON THE TONSILS: Is there pus on the tonsils in the back of your  throat?    Denies swelling,redness, pus on tonsils, drooling 7. OTHER SYMPTOMS: Do you have any other symptoms? (e.g., difficulty breathing, headache, rash)     Denies  Protocols used: Sore Throat-A-AH

## 2024-03-30 NOTE — Telephone Encounter (Signed)
 Called patient and LVM to either call us  back to be scheduled or she can be seen at urgent care today if she can get in with us 

## 2024-03-30 NOTE — Telephone Encounter (Signed)
**Note De-identified  [place] Obfuscation** Please advise 

## 2024-03-31 NOTE — Telephone Encounter (Signed)
 It was very hard to understand the patient but she declined a appointment and urgent care

## 2024-05-01 ENCOUNTER — Telehealth: Payer: Self-pay | Admitting: Neurology

## 2024-05-01 NOTE — Telephone Encounter (Signed)
 Patient said since last visit with Dr. Onita have had one dizzy spell.. Usually driving to work in the morning around 5 am. Feeling last a couple of seconds. Would like a call if can be worked in for a earlier appointment in the afternoon.

## 2024-05-01 NOTE — Telephone Encounter (Signed)
 LVM to call back to schedule an earlier appointment for 05/04/24 @ 2:30pm.

## 2024-05-01 NOTE — Telephone Encounter (Signed)
 Spoke w/ Dr. Onita who approved seeing pt for evaluation

## 2024-05-04 ENCOUNTER — Encounter: Payer: Self-pay | Admitting: Neurology

## 2024-05-04 ENCOUNTER — Ambulatory Visit (INDEPENDENT_AMBULATORY_CARE_PROVIDER_SITE_OTHER): Admitting: Neurology

## 2024-05-04 ENCOUNTER — Other Ambulatory Visit: Payer: Self-pay | Admitting: Obstetrics & Gynecology

## 2024-05-04 VITALS — BP 126/84 | HR 77 | Ht 62.0 in | Wt 186.0 lb

## 2024-05-04 DIAGNOSIS — R42 Dizziness and giddiness: Secondary | ICD-10-CM | POA: Diagnosis not present

## 2024-05-04 DIAGNOSIS — I679 Cerebrovascular disease, unspecified: Secondary | ICD-10-CM | POA: Diagnosis not present

## 2024-05-04 DIAGNOSIS — R928 Other abnormal and inconclusive findings on diagnostic imaging of breast: Secondary | ICD-10-CM

## 2024-05-04 NOTE — Progress Notes (Signed)
 Chief Complaint  Patient presents with   Dizziness    Emgrm3, alone, Pt reported dizziness: orthostatic bp completed      ASSESSMENT AND PLAN  [name] is a 63 y.o. 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   MEDICAL HISTORY:  Kayla Barnett, is a 63 year old female seen in request by her primary care doctor Rollene Norris for evaluation of dizziness, abnormal MRI of the brain  History is obtained from the patient and review of electronic medical records. I personally reviewed pertinent available imaging films in PACS.   PMHx of  Obesity Pre-DM  April 08, 2023, she got up around 4 AM, felt dizziness, mild unsteady gait, she drove herself to hospital, was noted to have significantly elevated blood pressure 189/92, she denies headache, symptoms last about 1 hour gradually resolved, there was no residual deficit  Workup including MRI of the brain showed chronic lacunar infarction involving bilateral cerebellum hemisphere, there was no acute abnormality  CT angiogram of head and neck showed evidence of intracranial atherosclerotic disease, no significant cervical carotid artery stenosis,  Lab in 2024, A1c was 6.3, LDL was 150, she has a history of syphilis was treated with antibiotic in 2024,antibody remain positive,  Over past few months, she has intermittent transient dizziness sensation, not  vertigo, lasting for few minutes, no headache  UPDATE Nov 6th 2025: She is overall doing very well, occasional lightheaded sensation, no clear triggering event, mostly with positional change, sometimes happen sitting down, denies confusion, denies chest pain heart racing fast She had a history of lacunar infarction involving bilateral cerebellum hemisphere, previously ordered echocardiogram was not completed, on aspirin 81 mg daily  PHYSICAL EXAM:   Vitals:   05/04/24 1433 05/04/24 1434 05/04/24 1436  BP: (!) 173/83 (!) 145/85 126/84  Pulse: 81 73 77  SpO2: 98% 98% 98%  Weight: 186 lb (84.4 kg)    Height: 5' 2 (1.575 m)     Body mass index is 34.02 kg/m.  PHYSICAL EXAMNIATION:  Gen: NAD, conversant, well nourised, well groomed                     Cardiovascular: Regular rate rhythm, no peripheral edema, warm, nontender. Eyes: Conjunctivae clear without exudates or hemorrhage Neck: Supple, no carotid bruits. Pulmonary: Clear to auscultation bilaterally   NEUROLOGICAL EXAM:  MENTAL STATUS: Speech/cognition: Awake, alert, oriented to history taking and casual conversation CRANIAL NERVES: CN II: Visual fields are full to confrontation. Pupils are round equal and briskly reactive to light. CN III, IV, VI: extraocular movement are normal. No ptosis. CN V: Facial sensation is intact to light touch CN VII: Face is symmetric with normal eye closure  CN VIII: Hearing is normal to causal conversation. CN IX, X: Phonation is normal. CN XI: Head turning and shoulder shrug are intact  MOTOR: There is no pronator drift of out-stretched arms. Muscle bulk and tone are normal. Muscle strength is normal.  REFLEXES: Reflexes are 2+ and symmetric  at the biceps, triceps, knees, and ankles. Plantar responses are flexor.  SENSORY: Intact to light touch, pinprick and vibratory sensation are intact in fingers and toes.  COORDINATION: There is no trunk or limb dysmetria  noted.  GAIT/STANCE: Posture is normal. Gait is steady with normal steps, base, arm swing, and turning. Heel and toe walking are normal. Tandem gait is normal.  Romberg is absent.  Epley's maneuver right ear dependent position, did not trigger vertigo or nystagmus,  REVIEW OF SYSTEMS:  Full 14 system review of systems performed and notable only for as above All other review of systems were negative.   ALLERGIES: Allergies  Allergen Reactions   Penicillins Swelling    Has patient had a PCN reaction causing immediate rash, facial/tongue/throat swelling, SOB or lightheadedness with hypotension: yes Has patient had a PCN reaction causing severe rash involving mucus membranes or skin necrosis: No Has patient had a PCN reaction that required hospitalization No Has patient had a PCN reaction occurring within the last 10 years: Yes If all of the above answers are NO, then may proceed with Cephalosporin use.     HOME MEDICATIONS: Current Outpatient Medications  Medication Sig Dispense Refill   meclizine  (ANTIVERT ) 25 MG tablet Take 1 tablet (25 mg total) by mouth 3 (three) times daily as needed for dizziness. 15 tablet 0   pravastatin  (PRAVACHOL ) 20 MG tablet Take 1 tablet (20 mg total) by mouth daily. 90 tablet 3   No current facility-administered medications for this visit.    PAST MEDICAL HISTORY: Past Medical History:  Diagnosis Date   Heart murmur    mild MR   Pre-diabetes    Rheumatic fever    Vitamin D  deficiency     PAST SURGICAL HISTORY: Past Surgical History:  Procedure Laterality Date   child birth  07/1992   CYSTECTOMY     on back 25-30 yrs ago; today is 2016   TUBAL LIGATION  2005    FAMILY HISTORY: Family History  Problem Relation Age of Onset   Migraines Mother    High blood pressure Mother    High Cholesterol Mother    Colon cancer Neg Hx    Colon polyps Neg Hx    Stroke Neg Hx    Seizures Neg Hx    Parkinsonism Neg Hx     SOCIAL  HISTORY: Social History   Socioeconomic History   Marital status: Single    Spouse name: Not on file   Number of children: Not on file   Years of education: Not on file   Highest education level: Not on file  Occupational History   Not on file  Tobacco Use   Smoking status: Never   Smokeless tobacco: Never  Vaping Use   Vaping status: Never Used  Substance and Sexual Activity   Alcohol use: Yes    Alcohol/week: 3.0 standard drinks of alcohol    Types: 1 Glasses of wine, 1 Cans of beer, 1 Shots of liquor per week    Comment: occasional   Drug use: No   Sexual activity: Not on file  Other Topics Concern   Not on file  Social History Narrative   Not on file   Social Drivers of Health   Financial Resource Strain: Not on file  Food Insecurity: Not on file  Transportation Needs: Not on file  Physical Activity: Not on file  Stress: Not on file  Social Connections: Not on file  Intimate Partner Violence: Not on file  Jordon Kristiansen, M.D. Ph.D.  [facility] [address] [place], [place] [address] Ph: [phone] Fax: [phone]  CC:  Rollene Almarie LABOR, MD [address] [place],  [place] [address]  Rollene Almarie LABOR, MD

## 2024-05-16 ENCOUNTER — Other Ambulatory Visit: Payer: Self-pay | Admitting: Obstetrics & Gynecology

## 2024-05-16 ENCOUNTER — Ambulatory Visit
Admission: RE | Admit: 2024-05-16 | Discharge: 2024-05-16 | Disposition: A | Discharge status: Home / Self Care | Source: Ambulatory Visit | Attending: Obstetrics & Gynecology | Admitting: Obstetrics & Gynecology

## 2024-05-16 ENCOUNTER — Other Ambulatory Visit

## 2024-05-16 DIAGNOSIS — R928 Other abnormal and inconclusive findings on diagnostic imaging of breast: Secondary | ICD-10-CM

## 2024-05-26 ENCOUNTER — Ambulatory Visit (HOSPITAL_COMMUNITY)
Admission: RE | Admit: 2024-05-26 | Discharge: 2024-05-26 | Disposition: A | Discharge status: Home / Self Care | Source: Ambulatory Visit | Attending: Neurology | Admitting: Neurology

## 2024-05-26 DIAGNOSIS — I080 Rheumatic disorders of both mitral and aortic valves: Secondary | ICD-10-CM | POA: Insufficient documentation

## 2024-05-26 DIAGNOSIS — R42 Dizziness and giddiness: Secondary | ICD-10-CM | POA: Diagnosis present

## 2024-05-26 DIAGNOSIS — I6389 Other cerebral infarction: Secondary | ICD-10-CM

## 2024-05-26 DIAGNOSIS — I679 Cerebrovascular disease, unspecified: Secondary | ICD-10-CM | POA: Insufficient documentation

## 2024-05-26 LAB — ECHOCARDIOGRAM COMPLETE
AR max vel: 1.94 cm2
AV Area VTI: 2.43 cm2
AV Area mean vel: 2.37 cm2
AV Mean grad: 6 mmHg
AV Peak grad: 12 mmHg
Ao pk vel: 1.73 m/s
Area-P 1/2: 3.5 cm2
S' Lateral: 2.9 cm

## 2024-05-29 ENCOUNTER — Ambulatory Visit: Payer: Self-pay | Admitting: Neurology

## 2024-05-29 ENCOUNTER — Encounter

## 2024-06-12 ENCOUNTER — Ambulatory Visit
Admission: RE | Admit: 2024-06-12 | Discharge: 2024-06-12 | Disposition: A | Source: Ambulatory Visit | Attending: Obstetrics & Gynecology | Admitting: Obstetrics & Gynecology

## 2024-06-12 DIAGNOSIS — R928 Other abnormal and inconclusive findings on diagnostic imaging of breast: Secondary | ICD-10-CM

## 2024-06-12 HISTORY — PX: BREAST BIOPSY: SHX20

## 2024-06-13 LAB — SURGICAL PATHOLOGY

## 2024-07-05 ENCOUNTER — Ambulatory Visit

## 2024-07-05 DIAGNOSIS — I679 Cerebrovascular disease, unspecified: Secondary | ICD-10-CM

## 2024-07-05 DIAGNOSIS — R42 Dizziness and giddiness: Secondary | ICD-10-CM

## 2024-07-05 NOTE — Addendum Note (Signed)
 Addended by: Akaylah Lalley on: 07/05/2024 03:18 PM   Modules accepted: Orders

## 2024-07-06 DIAGNOSIS — R42 Dizziness and giddiness: Secondary | ICD-10-CM

## 2024-07-06 DIAGNOSIS — I679 Cerebrovascular disease, unspecified: Secondary | ICD-10-CM

## 2024-10-02 ENCOUNTER — Ambulatory Visit: Admitting: Neurology
